# Patient Record
Sex: Female | Born: 1995 | Race: White | Hispanic: No | Marital: Married | Smoking: Never smoker
Health system: Southern US, Community
[De-identification: ages and names within clinical notes are randomized; demographics above are authoritative.]

## PROBLEM LIST (undated history)

## (undated) DIAGNOSIS — M797 Fibromyalgia: Secondary | ICD-10-CM

## (undated) DIAGNOSIS — R519 Headache, unspecified: Secondary | ICD-10-CM

## (undated) DIAGNOSIS — F329 Major depressive disorder, single episode, unspecified: Secondary | ICD-10-CM

## (undated) DIAGNOSIS — F32A Depression, unspecified: Secondary | ICD-10-CM

## (undated) DIAGNOSIS — T7840XA Allergy, unspecified, initial encounter: Secondary | ICD-10-CM

## (undated) DIAGNOSIS — F419 Anxiety disorder, unspecified: Secondary | ICD-10-CM

## (undated) DIAGNOSIS — R51 Headache: Secondary | ICD-10-CM

## (undated) HISTORY — DX: Headache, unspecified: R51.9

## (undated) HISTORY — DX: Allergy, unspecified, initial encounter: T78.40XA

## (undated) HISTORY — PX: WISDOM TOOTH EXTRACTION: SHX21

## (undated) HISTORY — DX: Headache: R51

## (undated) HISTORY — DX: Major depressive disorder, single episode, unspecified: F32.9

## (undated) HISTORY — PX: FOOT SURGERY: SHX648

## (undated) HISTORY — DX: Anxiety disorder, unspecified: F41.9

## (undated) HISTORY — PX: FRACTURE SURGERY: SHX138

## (undated) HISTORY — DX: Depression, unspecified: F32.A

## (undated) HISTORY — DX: Fibromyalgia: M79.7

---
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
## 2017-09-09 ENCOUNTER — Ambulatory Visit (INDEPENDENT_AMBULATORY_CARE_PROVIDER_SITE_OTHER): Payer: 59 | Admitting: Physician Assistant

## 2017-09-09 ENCOUNTER — Other Ambulatory Visit: Payer: Self-pay

## 2017-09-09 ENCOUNTER — Encounter: Payer: Self-pay | Admitting: Physician Assistant

## 2017-09-09 VITALS — BP 110/78 | HR 84 | Temp 98.5°F | Resp 18 | Ht 69.0 in | Wt 153.0 lb

## 2017-09-09 DIAGNOSIS — Z0000 Encounter for general adult medical examination without abnormal findings: Secondary | ICD-10-CM | POA: Diagnosis not present

## 2017-09-09 DIAGNOSIS — K589 Irritable bowel syndrome without diarrhea: Secondary | ICD-10-CM | POA: Insufficient documentation

## 2017-09-09 DIAGNOSIS — N83201 Unspecified ovarian cyst, right side: Secondary | ICD-10-CM | POA: Diagnosis not present

## 2017-09-09 DIAGNOSIS — F411 Generalized anxiety disorder: Secondary | ICD-10-CM

## 2017-09-09 DIAGNOSIS — N83202 Unspecified ovarian cyst, left side: Secondary | ICD-10-CM

## 2017-09-09 DIAGNOSIS — N83209 Unspecified ovarian cyst, unspecified side: Secondary | ICD-10-CM | POA: Insufficient documentation

## 2017-09-09 MED ORDER — DULOXETINE HCL 60 MG PO CPEP: 60.0000 mg | ORAL_CAPSULE | Freq: Every day | ORAL | 1 refills | Status: DC
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
## 2017-10-03 ENCOUNTER — Other Ambulatory Visit: Payer: Self-pay | Admitting: Physician Assistant

## 2017-10-03 DIAGNOSIS — F411 Generalized anxiety disorder: Secondary | ICD-10-CM

## 2017-10-21 ENCOUNTER — Ambulatory Visit (INDEPENDENT_AMBULATORY_CARE_PROVIDER_SITE_OTHER): Payer: 59 | Admitting: Physician Assistant

## 2017-10-21 ENCOUNTER — Encounter: Payer: Self-pay | Admitting: Physician Assistant

## 2017-10-21 VITALS — BP 110/60 | HR 97 | Temp 98.2°F | Resp 18 | Ht 69.0 in | Wt 154.8 lb

## 2017-10-21 DIAGNOSIS — F411 Generalized anxiety disorder: Secondary | ICD-10-CM
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
## 2018-03-22 ENCOUNTER — Other Ambulatory Visit: Payer: Self-pay | Admitting: Family Medicine

## 2018-04-19 ENCOUNTER — Other Ambulatory Visit: Payer: Self-pay | Admitting: Family Medicine

## 2018-04-19 DIAGNOSIS — F411 Generalized anxiety disorder: Secondary | ICD-10-CM

## 2018-04-19 NOTE — Telephone Encounter (Signed)
Last seen: 10/21/2017 Last office visit: Last filled 10/05/2017

## 2018-04-20 MED ORDER — DULOXETINE HCL 60 MG PO CPEP: ORAL_CAPSULE | ORAL | 0 refills | Status: DC

## 2018-04-20 NOTE — Telephone Encounter (Signed)
Called pharmacy and patient is no longer taking Celexa it was discontinued in August of last year. She is only taking Cymbalta for psych medication.

## 2018-04-20 NOTE — Telephone Encounter (Signed)
Can you please call the pharmacy and verify for me if she is taking celexa 10 mg?   This pt will need some kind of f/up.  She is new pt, est with [facility], cymbalta was a new med as of Sept 2019, and she never followed up.  Not sure how we are setting up telemedicine - but this pt would be one we need to do something like that for - or if we have the clinic set up for only well visits- she could come in for OV.

## 2018-04-21 ENCOUNTER — Other Ambulatory Visit: Payer: Self-pay

## 2018-04-21 ENCOUNTER — Ambulatory Visit (INDEPENDENT_AMBULATORY_CARE_PROVIDER_SITE_OTHER): Payer: Self-pay | Admitting: Family Medicine

## 2018-04-21 ENCOUNTER — Ambulatory Visit: Payer: 59 | Admitting: Physician Assistant

## 2018-04-21 ENCOUNTER — Encounter: Payer: Self-pay | Admitting: Family Medicine

## 2018-04-21 DIAGNOSIS — F411 Generalized anxiety disorder: Secondary | ICD-10-CM

## 2018-04-21 DIAGNOSIS — N83201 Unspecified ovarian cyst, right side: Secondary | ICD-10-CM | POA: Insufficient documentation

## 2018-04-21 DIAGNOSIS — N83202 Unspecified ovarian cyst, left side: Secondary | ICD-10-CM

## 2018-04-21 DIAGNOSIS — Z8742 Personal history of other diseases of the female genital tract: Secondary | ICD-10-CM | POA: Insufficient documentation

## 2018-04-21 MED ORDER — LEVONORGEST-ETH ESTRAD 91-DAY 0.15-0.03 &0.01 MG PO TABS: 1.0000 | ORAL_TABLET | Freq: Every day | ORAL | 3 refills | Status: DC

## 2018-04-21 MED ORDER — DULOXETINE HCL 60 MG PO CPEP: ORAL_CAPSULE | ORAL | 3 refills | Status: DC

## 2018-04-21 NOTE — Assessment & Plan Note (Signed)
Refill of oral birth control sent to new pharmacy  No contraindications F/up with OBGYN in several months from now (due to COVID-19) to est with specialist in area

## 2018-04-21 NOTE — Assessment & Plan Note (Signed)
Taking cymbalta 60 mg as prescribed, compliant, no SE Sx well controlled Refills sent to new pharmacy Encouraged 4-6 month f/up in office to in person establish care and f/up

## 2018-04-21 NOTE — Progress Notes (Signed)
Virtual Visit via Telephone Note  Telephone appointment arranged with Karalee Height on 04/21/18 at 12:00 PM EDT by telephone and verified that I am speaking with the correct person using two identifiers.   I discussed the limitations, risks, security and privacy concerns of performing an evaluation and management service by telephone and the availability of in person appointments. I also discussed with the patient that there may be a patient responsible charge related to this service. The patient expressed understanding and agreed to proceed.  Services provided today were via telemedicine through telephone call.  Patient reported their location during encounter was at home.  Patient consented to telephone visit  I conducted telephone visit from [facility]  Referring Provider:   Delsa Grana, PA-C   Start of phone call:  12:15 PM  All participants in encounter: Myself and pt   History of Present Illness: Pt was on celexa for 1-2 years and it stopped being effective, about 6 month ago she has OV with Karis Juba and med was changed to cymbalta 60 mg.  On Cymbalta, her symptoms are much better.  Since starting the medication 6 months ago she did not have any adverse side effects, she feels that the same dose is effective, and she has no concerns at this time.  She is due for med refill. She has not been seen since her visit last September 2019 with [facility], who unfortunately left the practice, I have asked to connect via telephone visit to discuss sx and meds with her before refilling her medications, having never met her. She states that her symptoms seem well managed, only notes a history of "anxiety" per chart review has a history of anxiety and depression.  She started a new job this week and is working at  [employer], and has had to do so from home without normal on the job training.  She feels that she is handling this stressful life change  fairly well, specially considering all the stress and uncertainty of NTIRW-43.   She is trying to stay at home as much as possible, she is avoiding family members that are immunocompromise to or elderly.  She is very adamant about staying away from anyone at risk for the next 4 months, and additionally notes that the nursing homes a fellow members are and have been close down. She will be working from home, efforts have been made to send out greetings to her and welcomes via email and offer to Skype or face time with new coworkers and faculty as she starts her job.   GAD-7 done:   04/21/18 1225      GAD-7 Over the last 2 weeks, how often have you been bothered by the following problems?  1. Feeling Nervous, Anxious, or on Edge Over half the days  2. Not Being Able to Stop or Control Worrying Several days  3. Worrying Too Much About Different Things Several days  4. Trouble Relaxing Not at all sure  5. Being So Restless it's Hard To Sit Still Several days  6. Becoming Easily Annoyed or Irritable Not at all sure  7. Feeling Afraid As If Something Awful Might Happen Over half the days  Total GAD-7 Score 7 (calculated)  Anxiety Difficulty  Difficulty At Work, Home, or [place] With Others? Somewhat difficult     Patient denies any depressive thoughts, suicidal ideations, homicidal ideations or auditory or visual hallucinations.   Patient notes a history of suspected  depression in her father but no known or confirmed diagnoses she denies any history of bipolar, specified anxiety disorders, or substance abuse, alcohol abuse, or exposure to trauma or domestic violence in her past.  Patient would like her Cymbalta refilled and sent to new pharmacy.  She does clarify that she has not taken Celexa since last year.  Med list was reviewed, there was duplicate oral birth control.  She confirms that she is sexually active, monogamous with her husband. Needs refill of birth control  She gives  additional med history stating that a doctor in different location has suspected that she has endometriosis and has switched her to [facility] to give her 66-monthcoverage.  Also reports history of hemorrhagic cysts, intermittent severe pain with menses, alternating severe and heavy bleeding with periods and alternating light periods.  She currently is on the second pack of the last 372-monthupply that she has and request a refill be sent to a different pharmacy as she has moved, she asked for to be [facility] She denies any breaks, gaps or missed doses in taking birth control, denies any chance of pregnancy, denies any current cigarette smoking, denies any history of DVT or PE.    No local OB/GYN, she should not mind establishing with specialist 3-4 months from now.   Patient Active Problem List   Diagnosis Date Noted  . History of menorrhagia 04/21/2018  . Cysts of both ovaries 04/21/2018  . Ovarian cyst 09/09/2017  . IBS (irritable bowel syndrome) 09/09/2017  . Generalized anxiety disorder 09/09/2017  . Capsulitis of toe 11/01/2012  . Pain 11/01/2012  . Neck pain 07/29/2012     Prior to Admission medications   Medication Sig Start Date End Date Taking? Authorizing Provider  acetaminophen (TYLENOL) 500 MG tablet Take 500 mg by mouth every 6 (six) hours as needed for pain.   Yes [provider]  cyclobenzaprine (FLEXERIL) 10 MG tablet Take 10 mg by mouth as needed for muscle spasms.   Yes [provider]  Levonorgestrel-Ethinyl Estradiol (AMETHIA,CAMRESE) 0.15-0.03 &0.01 MG tablet TAKE 1 TABLET BY MOUTH EVERY DAY 03/22/18  Yes TaDelsa GranaPA-C  Naproxen Sodium (ALEVE PO) Take by mouth as needed.   Yes [provider]  DULoxetine (CYMBALTA) 60 MG capsule TAKE 1 CAPSULE BY MOUTH EVERY DAY 04/21/18   TaDelsa GranaPA-C  Levonorgestrel-Ethinyl Estradiol (AMETHIA,CAMRESE) 0.15-0.03 &0.01 MG tablet Take 1 tablet by mouth daily.  05/22/18   TaDelsa GranaPA-C     Allergies  Allergen Reactions  . Codeine Nausea Only and Nausea And Vomiting     Family History  Problem Relation Age of Onset  . Hypertension Mother   . Cancer Paternal Aunt      Social History   Socioeconomic History  . Marital status: Married    Spouse name: Not on file  . Number of children: Not on file  . Years of education: Not on file  . Highest education level: Not on file  Occupational History  . Not on file  Social Needs  . Financial resource strain: Not on file  . Food insecurity:    Worry: Not on file    Inability: Not on file  . Transportation needs:    Medical: Not on file    Non-medical: Not on file  Tobacco Use  . Smoking status: Never Smoker  . Smokeless tobacco: Never Used  Substance and Sexual Activity  . Alcohol use: No  . Drug  use: No  . Sexual activity: Yes    Birth control/protection: Pill  Lifestyle  . Physical activity:    Days per week: Not on file    Minutes per session: Not on file  . Stress: Not on file  Relationships  . Social connections:    Talks on phone: Not on file    Gets together: Not on file    Attends religious service: Not on file    Active member of club or organization: Not on file    Attends meetings of clubs or organizations: Not on file    Relationship status: Not on file  . Intimate partner violence:    Fear of current or ex partner: Not on file    Emotionally abused: Not on file    Physically abused: Not on file    Forced sexual activity: Not on file  Other Topics Concern  . Not on file  Social History Narrative  . Not on file    Observations/Objective: Limited objective findings secondary to telephone encounter/telemedicine visit  Physical Exam Neurological:     Mental Status: She is alert.     Comments: Answers questions quickly, accurately and appropriately  Psychiatric:        Attention and Perception: Attention normal. She is attentive.        Mood and  Affect: Mood and affect normal. Mood is not anxious, depressed or elated. Affect is not labile, blunt, flat, tearful or inappropriate.        Speech: Speech normal.        Behavior: Behavior is cooperative.        Thought Content: Thought content normal. Thought content is not paranoid or delusional. Thought content does not include homicidal or suicidal ideation. Thought content does not include homicidal or suicidal plan.        Cognition and Memory: Memory normal.        Judgment: Judgment normal.     Assessment and Plan:  Problem List Items Addressed This Visit      Endocrine   Cysts of both ovaries   Relevant Medications   Levonorgestrel-Ethinyl Estradiol (AMETHIA,CAMRESE) 0.15-0.03 &0.01 MG tablet (Start on 05/22/2018)   Other Relevant Orders   Ambulatory referral to Obstetrics / Gynecology     Other   Generalized anxiety disorder - Primary    Taking cymbalta 60 mg as prescribed, compliant, no SE Sx well controlled Refills sent to new pharmacy Encouraged 4-6 month f/up in office to in person establish care and f/up      Relevant Medications   DULoxetine (CYMBALTA) 60 MG capsule   History of menorrhagia    Refill of oral birth control sent to new pharmacy  No contraindications F/up with OBGYN in several months from now (due to COVID-19) to est with specialist in area      Relevant Medications   Levonorgestrel-Ethinyl Estradiol (AMETHIA,CAMRESE) 0.15-0.03 &0.01 MG tablet (Start on 05/22/2018)   Other Relevant Orders   Ambulatory referral to Obstetrics / Gynecology     Chart reviewed and update with pt to establish care with me as new PCP, last PCP left practice. F/up in office visit when able.  Follow Up Instructions: Several months of meds sent to pharmacy, everything seems very stable, encouraged her to contact us as needed for any concerns, SE, or new sx.   I discussed the assessment and treatment plan with the patient. The patient was provided an opportunity to  ask questions and all were answered. The patient agreed  with the plan and demonstrated an understanding of the instructions.   The patient was advised to call back or seek an in-person evaluation if the symptoms worsen or if the condition fails to improve as anticipated.   12:38 PM I provided 22 minutes of non-face-to-face time during this encounter.   Delsa Grana, PA-C

## 2018-04-22 ENCOUNTER — Telehealth: Payer: Self-pay | Admitting: Obstetrics & Gynecology

## 2018-04-22 NOTE — Telephone Encounter (Signed)
We have a referral for this patient, she'll be a new patient to our office.  The referral is for cyst on both ovaries.  Office visit?  If so, How far out can I schedule her?

## 2018-04-22 NOTE — Telephone Encounter (Signed)
I will need to see the imaging she has had to make a determination there is nothing in EPIC

## 2018-04-23 NOTE — Telephone Encounter (Signed)
Per our conversation.

## 2018-04-26 ENCOUNTER — Encounter: Payer: Self-pay | Admitting: Obstetrics & Gynecology

## 2018-04-26 NOTE — Telephone Encounter (Signed)
I scheduled the patient with Dr. Despina Hidden on 05/18/18 at 1:30pm.  I was unable to reach the patient by phone, mailed her a letter with her appointment information.

## 2018-05-18 ENCOUNTER — Encounter: Payer: Self-pay | Admitting: Obstetrics & Gynecology

## 2018-05-18 ENCOUNTER — Other Ambulatory Visit: Payer: Self-pay

## 2018-05-18 ENCOUNTER — Ambulatory Visit (INDEPENDENT_AMBULATORY_CARE_PROVIDER_SITE_OTHER): Admitting: Obstetrics & Gynecology

## 2018-05-18 VITALS — Ht 70.0 in | Wt 150.0 lb

## 2018-05-18 DIAGNOSIS — N83201 Unspecified ovarian cyst, right side: Secondary | ICD-10-CM

## 2018-05-18 DIAGNOSIS — N83202 Unspecified ovarian cyst, left side: Secondary | ICD-10-CM

## 2018-05-18 MED ORDER — LEVONORGEST-ETH ESTRAD 91-DAY 0.15-0.03 &0.01 MG PO TABS: 1.0000 | ORAL_TABLET | Freq: Every day | ORAL | 4 refills | Status: DC

## 2018-05-18 NOTE — Progress Notes (Signed)
   TELEHEALTH VIRTUAL GYNECOLOGY VISIT ENCOUNTER NOTE  I connected with Lisa Craig on 05/18/18 at  1:30 PM EDT by telephone at home and verified that I am speaking with the correct person using two identifiers.   I discussed the limitations, risks, security and privacy concerns of performing an evaluation and management service by telephone and the availability of in person appointments. I also discussed with the patient that there may be a patient responsible charge related to this service. The patient expressed understanding and agreed to proceed.   History:  Lisa Craig is a 23 y.o. No obstetric history on file. female being evaluated today for a history of ovarian cysts, first diiagnosed 10/2016. She denies any abnormal vaginal discharge, bleeding, pelvic pain or other concerns.     then placed on OCP, 3 month birth control, menses are much improved on this regimen,  Had been on nexplanon but had prolonged spotting and discontinued Not having any recent problems specifically  Had scans done 10/18-1/19 none since  Rare pain with intercourse No pain otherwise except some menstrual cramps, not significantly     Past Medical History:  Diagnosis Date  . Allergy   . Anxiety   . Depression   . Frequent headaches    Past Surgical History:  Procedure Laterality Date  . FOOT SURGERY Right   . WISDOM TOOTH EXTRACTION     The following portions of the patient's history were reviewed and updated as appropriate: allergies, current medications, past family history, past medical history, past social history, past surgical history and problem list.   Health Maintenance:  Normal pap and negative HRHPV on usure.  Normal mammogram on n/a.   Review of Systems:  Pertinent items noted in HPI and remainder of comprehensive ROS otherwise negative.  Physical Exam:  Physical exam deferred due to nature of the encounter  Labs and Imaging No results found for this or any previous visit (from the  past 336 hour(s)). No results found.     Meds ordered this encounter  Medications  . Levonorgestrel-Ethinyl Estradiol (SEASONIQUE) 0.15-0.03 &0.01 MG tablet    Sig: Take 1 tablet by mouth daily.    Dispense:  1 Package    Refill:  4    No orders of the defined types were placed in this encounter.   Assessment and Plan:     Cysts of both ovaries some concern for possible endometriosis  Currently on seasonique  OCP for cycle management and to suppress ovarian cyst formation  I completely agree with continuing these OCPs, they give Lisa Craig good progestational suppression which she has done well with  Do not think Lisa Craig is needed or indicated given her excellent response to heavy handed progesterone, she agrees  Lisa Craig is refilled for patient       I discussed the assessment and treatment plan with the patient. The patient was provided an opportunity to ask questions and all were answered. The patient agreed with the plan and demonstrated an understanding of the instructions.   The patient was advised to call back or seek an in-person evaluation/go to the ED if the symptoms worsen or if the condition fails to improve as anticipated.  I provided  12 minutes of non-face-to-face time during this encounter.   Lazaro Arms, MD [facility]

## 2019-03-08 ENCOUNTER — Other Ambulatory Visit: Payer: Self-pay | Admitting: Family Medicine

## 2019-03-08 DIAGNOSIS — F411 Generalized anxiety disorder: Secondary | ICD-10-CM

## 2019-04-18 LAB — HM PAP SMEAR

## 2019-06-03 ENCOUNTER — Other Ambulatory Visit: Payer: Self-pay | Admitting: Obstetrics & Gynecology

## 2019-08-03 ENCOUNTER — Other Ambulatory Visit: Payer: Self-pay | Admitting: Orthopedic Surgery

## 2019-08-03 DIAGNOSIS — M5136 Other intervertebral disc degeneration, lumbar region: Secondary | ICD-10-CM

## 2020-03-13 ENCOUNTER — Other Ambulatory Visit: Payer: Self-pay | Admitting: Obstetrics & Gynecology

## 2020-08-06 ENCOUNTER — Encounter: Payer: Self-pay | Admitting: Podiatry

## 2020-08-06 ENCOUNTER — Ambulatory Visit: Admitting: Podiatry

## 2020-08-06 ENCOUNTER — Ambulatory Visit

## 2020-08-06 DIAGNOSIS — M66271 Spontaneous rupture of extensor tendons, right ankle and foot: Secondary | ICD-10-CM

## 2020-08-06 DIAGNOSIS — S92501A Displaced unspecified fracture of right lesser toe(s), initial encounter for closed fracture: Secondary | ICD-10-CM | POA: Diagnosis not present

## 2020-08-06 DIAGNOSIS — S97121A Crushing injury of right lesser toe(s), initial encounter: Secondary | ICD-10-CM

## 2020-08-06 DIAGNOSIS — Q709 Syndactyly, unspecified: Secondary | ICD-10-CM | POA: Diagnosis not present

## 2020-08-06 NOTE — Progress Notes (Signed)
  Subjective:  Patient ID: Lisa Craig, female    DOB: December 07, 1995,  MRN: [id number]  Chief Complaint  Patient presents with   Fracture    re est 2014 -broken pinky toe right foot    25 y.o. female presents with the above complaint. History confirmed with patient.  She injured her right fifth toe and caught it and was forcibly plantarflexed 1 week ago on 4 July.  She felt something snap.  She had a previous history of tendon and skin lengthening surgery to reduce a floating toe.  She had x-rays completed last week.  Objective:  Physical Exam: warm, good capillary refill, no trophic changes or ulcerative lesions, normal DP and PT pulses, and normal sensory exam.  She has sharp pain on the distal phalanx of the fifth toe and is unable to dorsiflex the toe   radiographs: Multiple views x-ray of the right foot reviewed from urgent care there is a small avulsion fracture at the base of the proximal phalanx as well, compared to prior radiographs in 2014 there is no longer some phalanges him of the middle and distal phalanx think she has a fracture of the previous synchondrosis here Assessment:   1. Closed fracture of phalanx of right fifth toe, initial encounter   2. Symphalangism of distal interphalangeal (DIP) joints   3. 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  Reviewed radiographic and clinical findings with the patient.  I think she has a refracture of the previous synostosis/synchondrosis of the middle and distal phalanges.  Additionally I think she likely ruptured the extensor tendon that was previously lengthened.  I discussed with her I will think there is a reasonable way to repair this currently.  Discussed with her if the plantar flexor he position of the toe continues to be bothersome or symptomatic then flexor tendon release could straighten the toe out.  I demonstrated splinting the toe with  buddy taping as well as Steri-Strips in a dorsiflexed position to allow to heal.  She will do this and return as needed  Return if symptoms worsen or fail to improve.

## 2020-09-12 ENCOUNTER — Other Ambulatory Visit: Payer: Self-pay

## 2020-09-12 ENCOUNTER — Encounter: Payer: Self-pay | Admitting: Podiatry

## 2020-09-12 ENCOUNTER — Ambulatory Visit (INDEPENDENT_AMBULATORY_CARE_PROVIDER_SITE_OTHER)

## 2020-09-12 ENCOUNTER — Ambulatory Visit: Admitting: Podiatry

## 2020-09-12 DIAGNOSIS — S92501A Displaced unspecified fracture of right lesser toe(s), initial encounter for closed fracture: Secondary | ICD-10-CM

## 2020-09-12 DIAGNOSIS — M66271 Spontaneous rupture of extensor tendons, right ankle and foot: Secondary | ICD-10-CM | POA: Diagnosis not present

## 2020-09-12 DIAGNOSIS — Q709 Syndactyly, unspecified: Secondary | ICD-10-CM | POA: Diagnosis not present

## 2020-09-12 NOTE — Progress Notes (Signed)
She presents today for second opinion she injured her toe on 30 July 2020 1 that we had performed a skin lengthening on and a cocked up hammertoe repair.  This had done very well until she stubbed the toe rolling the toe plantarly and plantar flexing the fifth metatarsal phalangeal joint aggressively.  She states that she felt and heard a snap.  Objective: Vital signs are stable alert oriented x3.  Pulses are palpable.  There is no erythema edema cellulitis drainage or odor the fifth digit is flail on dorsiflexion I am not able to palpate the long extensor tendon though the short flexor is intact.  She does have a rigid deformity at the PIPJ and more of a plantar flexion really internally rotated position.  Assessment torn extensor tendon fifth metatarsal phalangeal joint.  Plan: At this point I highly recommended that she have an MRI for evaluation for possible repair.  She is asking for an amputation of the toe I really do not want her to go through an amputation at a young age possibly having to deal with phantom pain after the rest of her life or discourse from friends and family members.  I will follow-up with her after her MRI is done.

## 2020-09-22 ENCOUNTER — Other Ambulatory Visit: Payer: Self-pay

## 2020-09-22 ENCOUNTER — Ambulatory Visit
Admission: RE | Admit: 2020-09-22 | Discharge: 2020-09-22 | Disposition: A | Discharge status: Home / Self Care | Source: Ambulatory Visit | Attending: Podiatry | Admitting: Podiatry

## 2020-09-22 DIAGNOSIS — Q709 Syndactyly, unspecified: Secondary | ICD-10-CM

## 2020-09-22 DIAGNOSIS — M66271 Spontaneous rupture of extensor tendons, right ankle and foot: Secondary | ICD-10-CM

## 2020-10-03 ENCOUNTER — Encounter: Payer: Self-pay | Admitting: Podiatry

## 2020-10-03 ENCOUNTER — Ambulatory Visit: Admitting: Podiatry

## 2020-10-03 ENCOUNTER — Other Ambulatory Visit: Payer: Self-pay

## 2020-10-03 DIAGNOSIS — M66271 Spontaneous rupture of extensor tendons, right ankle and foot: Secondary | ICD-10-CM | POA: Diagnosis not present

## 2020-10-03 DIAGNOSIS — M2041 Other hammer toe(s) (acquired), right foot: Secondary | ICD-10-CM

## 2020-10-03 NOTE — Progress Notes (Signed)
She presents today to discuss surgical options regarding a ruptured extensor tendon left foot.  Objective: Vital signs are stable alert and oriented x3.  Pulses are palpable.  Fifth digit does demonstrate plantar flexion contracture at the PIPJ secondary to fracture.  It is rigid at this point.  MRI does relate with a tear of the extensor tendon at the level of the PIPJ fifth digit right foot.  Assessment: Rupture extensor tendon fifth digit right.  Plan: Consented her today for extensor tendon repair and arthroplasty arthrodesis fifth digit right foot.  We did discuss the possible alternatives such as amputation of the toe which we will discuss should this fail or become too complicated to repair.  We did discuss the possible postop complications which may include but not limited to postop pain bleeding swelling infection recurrence need further surgery overcorrection under correction loss of digit loss of limb loss of life.  I will follow-up with her in the near future for repair of this toe general anesthesia no block.

## 2020-10-17 ENCOUNTER — Other Ambulatory Visit: Payer: Self-pay | Admitting: Podiatry

## 2020-10-17 MED ORDER — ONDANSETRON HCL 4 MG PO TABS: 4.0000 mg | ORAL_TABLET | Freq: Three times a day (TID) | ORAL | 0 refills | Status: DC | PRN

## 2020-10-17 MED ORDER — OXYCODONE-ACETAMINOPHEN 10-325 MG PO TABS: 1.0000 | ORAL_TABLET | Freq: Three times a day (TID) | ORAL | 0 refills | Status: AC | PRN

## 2020-10-17 MED ORDER — CEPHALEXIN 500 MG PO CAPS: 500.0000 mg | ORAL_CAPSULE | Freq: Three times a day (TID) | ORAL | 0 refills | Status: DC

## 2020-10-19 DIAGNOSIS — M66271 Spontaneous rupture of extensor tendons, right ankle and foot: Secondary | ICD-10-CM | POA: Diagnosis not present

## 2020-10-19 DIAGNOSIS — M2041 Other hammer toe(s) (acquired), right foot: Secondary | ICD-10-CM | POA: Diagnosis not present

## 2020-10-24 ENCOUNTER — Other Ambulatory Visit: Payer: Self-pay

## 2020-10-24 ENCOUNTER — Ambulatory Visit (INDEPENDENT_AMBULATORY_CARE_PROVIDER_SITE_OTHER)

## 2020-10-24 ENCOUNTER — Encounter: Payer: Self-pay | Admitting: Podiatry

## 2020-10-24 ENCOUNTER — Ambulatory Visit (INDEPENDENT_AMBULATORY_CARE_PROVIDER_SITE_OTHER): Admitting: Podiatry

## 2020-10-24 DIAGNOSIS — Z9889 Other specified postprocedural states: Secondary | ICD-10-CM

## 2020-10-24 DIAGNOSIS — M2041 Other hammer toe(s) (acquired), right foot: Secondary | ICD-10-CM | POA: Diagnosis not present

## 2020-10-24 DIAGNOSIS — M66271 Spontaneous rupture of extensor tendons, right ankle and foot: Secondary | ICD-10-CM

## 2020-10-24 NOTE — Progress Notes (Signed)
She presents today date of surgery 10/19/2020 right foot arthroplasty with fusion fifth and repair of the extensor tendon.  States that her big toe is somewhat numb and the top of her foot hurts where the bandage was.  She states that the pain medication is really not helping but she has gabapentin at home and would like to try that.  She says she has gabapentin 100 mg.  Objective: Presents nonweightbearing with crutches and has been.  Cam boot intact dry sterile dressing intact was removed demonstrates into the fifth digit of the right foot is intact there is no erythema just some mild edema no cellulitis drainage or odor sutures are intact margins well coapted radiographs demonstrate well-placed hammertoe repair fifth right.  Assessment well-healing surgical foot.  Plan: Redressed today dressed a compressive dressing encouraged her to use the gabapentin rather than the oxycodone if necessary.  Explained to her that she can still take ibuprofen and Tylenol with this I would like to follow-up with her in 1 week for suture removal if possible.

## 2020-10-31 ENCOUNTER — Encounter: Payer: Self-pay | Admitting: Podiatry

## 2020-10-31 ENCOUNTER — Ambulatory Visit (INDEPENDENT_AMBULATORY_CARE_PROVIDER_SITE_OTHER): Admitting: Podiatry

## 2020-10-31 ENCOUNTER — Other Ambulatory Visit: Payer: Self-pay

## 2020-10-31 DIAGNOSIS — M2041 Other hammer toe(s) (acquired), right foot: Secondary | ICD-10-CM

## 2020-10-31 DIAGNOSIS — Z9889 Other specified postprocedural states: Secondary | ICD-10-CM

## 2020-10-31 DIAGNOSIS — M66271 Spontaneous rupture of extensor tendons, right ankle and foot: Secondary | ICD-10-CM

## 2020-10-31 MED ORDER — GABAPENTIN 100 MG PO CAPS: 100.0000 mg | ORAL_CAPSULE | Freq: Every day | ORAL | 3 refills | Status: DC

## 2020-10-31 MED ORDER — GABAPENTIN 100 MG PO CAPS
100.0000 mg | ORAL_CAPSULE | Freq: Every day | ORAL | 3 refills | Status: DC
Start: 2020-10-31 — End: 2020-10-31

## 2020-10-31 NOTE — Progress Notes (Signed)
She presents today date of surgery 10/19/2020 status post arthrodesis of the fifth toe with a tailor's bunionectomy.  States the incision feels better no problems there.  She said the bone still is a bit sore though.  He also like a refill on her gabapentin 100 mg.  Objective: Vital signs are stable she is alert oriented x3 there is no erythema just some mild edema no cellulitis drainage or odor appears to be a rubbing injury on the posterior aspect of her Achilles area left does not demonstrate any signs of infection at this time.  Sutures are intact margins well coapted K wires intact to the fifth toe.  Assessment well-healing surgical foot.  Plan: Sutures were removed today light dressing was applied we will go to request that she utilize her Darco shoe.  I refilled her gabapentin 100 mg to be taken at nighttime.  I also recommended placing a Band-Aid on the area on the posterior aspect of the leg.  I would like to follow-up with her in about 2 weeks for set of x-rays.

## 2020-11-05 ENCOUNTER — Encounter: Payer: Self-pay | Admitting: Podiatry

## 2020-11-14 ENCOUNTER — Ambulatory Visit (INDEPENDENT_AMBULATORY_CARE_PROVIDER_SITE_OTHER)

## 2020-11-14 ENCOUNTER — Other Ambulatory Visit: Payer: Self-pay

## 2020-11-14 ENCOUNTER — Ambulatory Visit (INDEPENDENT_AMBULATORY_CARE_PROVIDER_SITE_OTHER): Admitting: Podiatry

## 2020-11-14 ENCOUNTER — Encounter: Payer: Self-pay | Admitting: Podiatry

## 2020-11-14 DIAGNOSIS — M66271 Spontaneous rupture of extensor tendons, right ankle and foot: Secondary | ICD-10-CM

## 2020-11-14 DIAGNOSIS — M2041 Other hammer toe(s) (acquired), right foot: Secondary | ICD-10-CM

## 2020-11-14 DIAGNOSIS — Z9889 Other specified postprocedural states: Secondary | ICD-10-CM

## 2020-11-14 NOTE — Progress Notes (Signed)
She presents today date of surgery 10/19/2020 right foot arthroplasty fusion fifth toe due to extensor tendon tear states that is feeling better the pain is irritating my fourth toe.  Denies fever chills nausea run muscle aches and pains.  Objective: Vital signs are stable alert and oriented x3 there is no erythema edema cellulitis drainage or odor and K wire to the fifth toe is intact and appears to be healing radiographically.  Does not appear to be healed yet hesitant to pulling the pin at this point that she is requesting it.  She would like to have it pulled prior to her going to a wedding next week.  Assessment well-healing surgical foot I do not think the toe is completely healed as far as fusion goes yet.  Plan: At this point she is really requesting that the pin be pulled before the end of next week so that she can go to on her trip to the wedding.  I am going to set her up with Dr. Allena Katz waiting to the last minute to pull the pin on Thursday before she leaves on Friday.  I will follow-up with her a couple weeks after that.

## 2020-11-22 ENCOUNTER — Ambulatory Visit: Admitting: Podiatry

## 2020-11-28 ENCOUNTER — Other Ambulatory Visit: Payer: Self-pay

## 2020-11-28 ENCOUNTER — Other Ambulatory Visit: Payer: Self-pay | Admitting: Podiatry

## 2020-11-28 ENCOUNTER — Ambulatory Visit (INDEPENDENT_AMBULATORY_CARE_PROVIDER_SITE_OTHER): Admitting: Podiatry

## 2020-11-28 ENCOUNTER — Encounter: Payer: Self-pay | Admitting: Podiatry

## 2020-11-28 ENCOUNTER — Ambulatory Visit (INDEPENDENT_AMBULATORY_CARE_PROVIDER_SITE_OTHER)

## 2020-11-28 DIAGNOSIS — M66271 Spontaneous rupture of extensor tendons, right ankle and foot: Secondary | ICD-10-CM | POA: Diagnosis not present

## 2020-11-28 DIAGNOSIS — M2041 Other hammer toe(s) (acquired), right foot: Secondary | ICD-10-CM

## 2020-11-28 DIAGNOSIS — Z9889 Other specified postprocedural states: Secondary | ICD-10-CM

## 2020-11-28 NOTE — Progress Notes (Signed)
  Subjective:  Patient ID: Lisa Craig, female    DOB: 1995/05/23,  MRN: [id number]  Chief Complaint  Patient presents with   Routine Post Op    POV #4 DOS 10/19/2020 RT FOOT ARTHROPLASTY/FUSION OF THE 5TH TOE, REPAIR OF THE EXTENSOR TENDON 5TH TOE    "I went to a wedding and I was on it a lot. It was very sore and swollen, still pretty sore. All the toes are still numb as well"     25 y.o. female returns for post-op check.  Overall improving pain is coming down.  Thinks the pain may have displaced a bit  Review of Systems: Negative except as noted in the HPI. Denies N/V/F/Ch.   Objective:  There were no vitals filed for this visit. There is no height or weight on file to calculate BMI. Constitutional Well developed. Well nourished.  Vascular Foot warm and well perfused. Capillary refill normal to all digits.   Neurologic Normal speech. Oriented to person, place, and time. Epicritic sensation to light touch grossly present bilaterally.  Dermatologic Skin healing well without signs of infection. Skin edges well coapted without signs of infection.  Orthopedic: Tenderness to palpation noted about the surgical site.  Kirschner wire still in position   Multiple view plain film radiographs: Some distal translation of the wire but still crossing surgical site Assessment:   1. Hammer toe of right foot   2. 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 Status post right foot surgery    Plan:  Patient was evaluated and treated and all questions answered.  S/p foot surgery right -Progressing as expected post-operatively. -XR: As noted above she had some distal translation of the wire but was not an issue -WB Status: Can transition to regular shoe gear as tolerated -Kirschner wire removed uneventfully she may resume bathing Monday  Return in 4 weeks (on 12/26/2020) for POV w/ xray.

## 2021-01-02 ENCOUNTER — Other Ambulatory Visit: Payer: Self-pay

## 2021-01-02 ENCOUNTER — Ambulatory Visit (INDEPENDENT_AMBULATORY_CARE_PROVIDER_SITE_OTHER)

## 2021-01-02 ENCOUNTER — Encounter: Payer: Self-pay | Admitting: Podiatry

## 2021-01-02 ENCOUNTER — Ambulatory Visit (INDEPENDENT_AMBULATORY_CARE_PROVIDER_SITE_OTHER): Admitting: Podiatry

## 2021-01-02 DIAGNOSIS — M66271 Spontaneous rupture of extensor tendons, right ankle and foot: Secondary | ICD-10-CM

## 2021-01-02 DIAGNOSIS — Z9889 Other specified postprocedural states: Secondary | ICD-10-CM

## 2021-01-02 DIAGNOSIS — M2041 Other hammer toe(s) (acquired), right foot: Secondary | ICD-10-CM

## 2021-01-02 NOTE — Progress Notes (Signed)
She presents today for postop visit date of surgery 10/19/2020 right foot arthroplasty or fusion fifth toe right with extensor tendon transfer states that is doing good had to buy new shoes but other than that she states I am doing pretty well and is sitting rectus.  She states that occasionally it works its way under the fourth toe and is kind of tender.  She is also noted a small nodule on the plantar aspect of the toe itself.  No pain otherwise other than with sharp dorsiflexion.  Objective: Vital signs are stable she is alert and oriented x3 pulses are palpable.  Nodule plantar aspect of the middle of the toe is the condyles of the proximal phalanx distalmost aspect.  There is no callused material associated with this.  She also has some tenderness on dorsiflexion at the metatarsophalangeal joint.  Radiographs taken today demonstrate an osseously mature individual with a tailor's bunionectomy and a fusion of the fifth digit.  It is fused rectus but only demonstrates about 50% or less fusion across the PIPJ.  Assessment: Well-healing surgical foot right.  Plan: Encouraged her to continue to be careful with the fifth toe to make sure that she does not fracture the minimal fusion site and then I recommended that it would go ahead and continue the use of her stiffer soled shoe.  She understands this and is amendable to it I explained to her that this will continue to fuse over time.  If she has any problems with that she will notify us.

## 2021-02-12 ENCOUNTER — Other Ambulatory Visit: Payer: Self-pay

## 2021-02-12 ENCOUNTER — Ambulatory Visit
Admission: RE | Admit: 2021-02-12 | Discharge: 2021-02-12 | Disposition: A | Discharge status: Home / Self Care | Source: Ambulatory Visit | Attending: Student | Admitting: Student

## 2021-02-12 VITALS — BP 123/66 | HR 117 | Temp 98.9°F | Resp 18

## 2021-02-12 DIAGNOSIS — R112 Nausea with vomiting, unspecified: Secondary | ICD-10-CM | POA: Diagnosis not present

## 2021-02-12 DIAGNOSIS — J069 Acute upper respiratory infection, unspecified: Secondary | ICD-10-CM | POA: Diagnosis not present

## 2021-02-12 DIAGNOSIS — R0989 Other specified symptoms and signs involving the circulatory and respiratory systems: Secondary | ICD-10-CM

## 2021-02-12 DIAGNOSIS — Z3041 Encounter for surveillance of contraceptive pills: Secondary | ICD-10-CM

## 2021-02-12 MED ORDER — PREDNISONE 20 MG PO TABS: 40.0000 mg | ORAL_TABLET | Freq: Every day | ORAL | 0 refills | Status: AC

## 2021-02-12 MED ORDER — OSELTAMIVIR PHOSPHATE 75 MG PO CAPS: 75.0000 mg | ORAL_CAPSULE | Freq: Two times a day (BID) | ORAL | 0 refills | Status: DC

## 2021-02-12 MED ORDER — ONDANSETRON 8 MG PO TBDP: 8.0000 mg | ORAL_TABLET | Freq: Three times a day (TID) | ORAL | 0 refills | Status: DC | PRN

## 2021-02-12 NOTE — ED Triage Notes (Signed)
Home covid test yesterday was negative. 

## 2021-02-12 NOTE — Discharge Instructions (Addendum)
-  Tamiflu twice daily x5 days. This medication can cause nausea, so I also sent nausea medication. You can stop the Tamiflu if you don't like it or if it causes side effects.  -Take the Zofran (ondansetron) up to 3 times daily for nausea and vomiting. Dissolve one pill under your tongue or between your teeth and your cheek. -Prednisone, 2 pills taken at the same time for 5 days in a row.  Try taking this earlier in the day as it can give you energy. Avoid NSAIDs like ibuprofen and alleve while taking this medication as they can increase your risk of stomach upset and even GI bleeding when in combination with a steroid. You can continue tylenol (acetaminophen) up to 1000mg  3x daily. -Drink plenty of fluids! -With a virus, you're typically contagious for 5-7 days, or as long as you're having fevers.

## 2021-02-12 NOTE — ED Provider Notes (Signed)
[facility]    CSN: [id number] Arrival date & time: 02/12/21  1438      History   Chief Complaint No chief complaint on file.   HPI Lisa Craig is a 26 y.o. female presenting with sore throat and fevers x2 days.  Medical history IBS. PND and nausea without vomiting, denies abd pain, diarrhea. Myalgias. Cough is nonproductive.. Negative home COVID test on day 2. Has attempted OTC medications. Denies SOB, CP.  HPI  Past Medical History:  Diagnosis Date   Allergy    Anxiety    Depression    Frequent headaches     Patient Active Problem List   Diagnosis Date Noted   History of menorrhagia 04/21/2018   Cysts of both ovaries 04/21/2018   Ovarian cyst 09/09/2017   IBS (irritable bowel syndrome) 09/09/2017   Generalized anxiety disorder 09/09/2017   Capsulitis of toe 11/01/2012   Pain 11/01/2012   Neck pain 07/29/2012    Past Surgical History:  Procedure Laterality Date   FOOT SURGERY Right    WISDOM TOOTH EXTRACTION      OB History   No obstetric history on file.      Home Medications    Prior to Admission medications   Medication Sig Start Date End Date Taking? Authorizing Provider  ondansetron (ZOFRAN-ODT) 8 MG disintegrating tablet Take 1 tablet (8 mg total) by mouth every 8 (eight) hours as needed for nausea or vomiting. 02/12/21  Yes Hazel Sams, PA-C  oseltamivir (TAMIFLU) 75 MG capsule Take 1 capsule (75 mg total) by mouth every 12 (twelve) hours. 02/12/21  Yes Hazel Sams, PA-C  predniSONE (DELTASONE) 20 MG tablet Take 2 tablets (40 mg total) by mouth daily for 5 days. Take with breakfast or lunch. Avoid NSAIDs (ibuprofen, etc) while taking this medication. 02/12/21 02/17/21 Yes Hazel Sams, PA-C  buPROPion (WELLBUTRIN XL) 300 MG 24 hr tablet Take by mouth. 06/21/20 06/21/21  [provider]  SIMPESSE 0.15-0.03 &0.01 MG tablet TAKE 1 TABLET BY MOUTH DAILY 03/13/20   Florian Buff, MD    Family History Family History  Problem  Relation Age of Onset   Hypertension Mother    Cancer Paternal Aunt     Social History Social History   Tobacco Use   Smoking status: Never   Smokeless tobacco: Never  Vaping Use   Vaping Use: Never used  Substance Use Topics   Alcohol use: Yes    Comment: occ. wine   Drug use: No     Allergies   Tramadol and Codeine   Review of Systems Review of Systems  Constitutional:  Negative for appetite change, chills and fever.  HENT:  Positive for congestion and sore throat. Negative for ear pain, rhinorrhea, sinus pressure and sinus pain.   Eyes:  Negative for redness and visual disturbance.  Respiratory:  Positive for cough. Negative for chest tightness, shortness of breath and wheezing.   Cardiovascular:  Negative for chest pain and palpitations.  Gastrointestinal:  Negative for abdominal pain, constipation, diarrhea, nausea and vomiting.  Genitourinary:  Negative for dysuria, frequency and urgency.  Musculoskeletal:  Negative for myalgias.  Neurological:  Negative for dizziness, weakness and headaches.  Psychiatric/Behavioral:  Negative for confusion.   All other systems reviewed and are negative.   Physical Exam Triage Vital Signs ED Triage Vitals  Enc Vitals Group     BP 02/12/21 1446 123/66     Pulse Rate 02/12/21 1446 (!) 117     Resp  02/12/21 1446 18     Temp 02/12/21 1446 98.9 F (37.2 C)     Temp Source 02/12/21 1446 Oral     SpO2 02/12/21 1446 96 %     Weight --      Height --      Head Circumference --      Peak Flow --      Pain Score 02/12/21 1447 4     Pain Loc --      Pain Edu? --      Excl. in [place]? --    No data found.  Updated Vital Signs BP 123/66 (BP Location: Right Arm)    Pulse (!) 117    Temp 98.9 F (37.2 C) (Oral)    Resp 18    LMP 12/11/2020 (Approximate)    SpO2 96%   Visual Acuity Right Eye Distance:   Left Eye Distance:   Bilateral Distance:    Right Eye Near:   Left Eye Near:    Bilateral Near:     Physical Exam Vitals  reviewed.  Constitutional:      General: She is not in acute distress.    Appearance: Normal appearance. She is not ill-appearing.  HENT:     Head: Normocephalic and atraumatic.     Right Ear: Tympanic membrane, ear canal and external ear normal. No tenderness. No middle ear effusion. There is no impacted cerumen. Tympanic membrane is not perforated, erythematous, retracted or bulging.     Left Ear: Tympanic membrane, ear canal and external ear normal. No tenderness.  No middle ear effusion. There is no impacted cerumen. Tympanic membrane is not perforated, erythematous, retracted or bulging.     Nose: Nose normal. No congestion.     Mouth/Throat:     Mouth: Mucous membranes are moist.     Pharynx: Uvula midline. No oropharyngeal exudate or posterior oropharyngeal erythema.  Eyes:     Extraocular Movements: Extraocular movements intact.     Pupils: Pupils are equal, round, and reactive to light.  Cardiovascular:     Rate and Rhythm: Regular rhythm. Tachycardia present.     Heart sounds: Normal heart sounds.  Pulmonary:     Effort: Pulmonary effort is normal.     Breath sounds: Normal breath sounds. No decreased breath sounds, wheezing, rhonchi or rales.  Abdominal:     General: Bowel sounds are increased.     Palpations: Abdomen is soft.     Tenderness: There is no abdominal tenderness. There is no guarding or rebound.     Comments: BS increased throughout. Comfortable throughout exam.  Lymphadenopathy:     Cervical: No cervical adenopathy.     Right cervical: No superficial cervical adenopathy.    Left cervical: No superficial cervical adenopathy.  Neurological:     General: No focal deficit present.     Mental Status: She is alert and oriented to person, place, and time.  Psychiatric:        Mood and Affect: Mood normal.        Behavior: Behavior normal.        Thought Content: Thought content normal.        Judgment: Judgment normal.     UC Treatments / Results   Labs (all labs ordered are listed, but only abnormal results are displayed) Labs Reviewed  COVID-19, FLU A+B NAA    EKG   Radiology No results found.  Procedures Procedures (including critical care time)  Medications Ordered in UC Medications - No data  to display  Initial Impression / Assessment and Plan / UC Course  I have reviewed the triage vital signs and the nursing notes.  Pertinent labs & imaging results that were available during my care of the patient were reviewed by me and considered in my medical decision making (see chart for details).     This patient is a very pleasant 26 y.o. year old female presenting with suspected influenza. OCP contraception. Afebrile but tachycardic at 117, 112 on manual repeat by myself.  Negative home COVID test on day 2. Covid and influenza PCR sent.   Tamiflu, zofran, and prednisone sent.  ED return precautions discussed. Patient verbalizes understanding and agreement.   Coding Level 4 for acute illness with systemic symptoms, and prescription drug management  Final Clinical Impressions(s) / UC Diagnoses   Final diagnoses:  Viral URI with cough  Nausea and vomiting, unspecified vomiting type  Oral contraceptive use  Suspected novel influenza A virus infection     Discharge Instructions      -Tamiflu twice daily x5 days. This medication can cause nausea, so I also sent nausea medication. You can stop the Tamiflu if you don't like it or if it causes side effects.  -Take the Zofran (ondansetron) up to 3 times daily for nausea and vomiting. Dissolve one pill under your tongue or between your teeth and your cheek. -Prednisone, 2 pills taken at the same time for 5 days in a row.  Try taking this earlier in the day as it can give you energy. Avoid NSAIDs like ibuprofen and alleve while taking this medication as they can increase your risk of stomach upset and even GI bleeding when in combination with a steroid. You can continue  tylenol (acetaminophen) up to 1000mg  3x daily. -Drink plenty of fluids! -With a virus, you're typically contagious for 5-7 days, or as long as you're having fevers.        ED Prescriptions     Medication Sig Dispense Auth. Provider   predniSONE (DELTASONE) 20 MG tablet Take 2 tablets (40 mg total) by mouth daily for 5 days. Take with breakfast or lunch. Avoid NSAIDs (ibuprofen, etc) while taking this medication. 10 tablet Hazel Sams, PA-C   ondansetron (ZOFRAN-ODT) 8 MG disintegrating tablet Take 1 tablet (8 mg total) by mouth every 8 (eight) hours as needed for nausea or vomiting. 20 tablet Hazel Sams, PA-C   oseltamivir (TAMIFLU) 75 MG capsule Take 1 capsule (75 mg total) by mouth every 12 (twelve) hours. 10 capsule Hazel Sams, PA-C      PDMP not reviewed this encounter.   Hazel Sams, PA-C 02/12/21 1505

## 2021-02-12 NOTE — ED Triage Notes (Signed)
Sore throat x 2 days.  Fever, off and on.  States she does have a headache.

## 2021-02-13 LAB — COVID-19, FLU A+B NAA
Influenza A, NAA: NOT DETECTED
Influenza B, NAA: NOT DETECTED
SARS-CoV-2, NAA: DETECTED — AB

## 2021-06-06 NOTE — Progress Notes (Signed)
? ?BP 124/77   Pulse (!) 107   Temp 98.5 ?F (36.9 ?C) (Oral)   Ht 5' 9.39" (1.763 m)   Wt 165 lb 12.8 oz (75.2 kg)   SpO2 98%   BMI 24.21 kg/m?   ? ?Subjective:  ? ? Patient ID: Lisa Craig, female    DOB: 08-05-1995, 26 y.o.   MRN: [id number] ? ?HPI: ?Lisa Craig is a 26 y.o. female ? ?Chief Complaint  ?Patient presents with  ? Establish Care  ? ?Patient presents to clinic to establish care with new PCP.  Introduced to Publishing rights manager role and practice setting.  All questions answered.  Discussed provider/patient relationship and expectations.  Patient reports a history of GAD and depression, migraines, IBS. ? ? ?Patient denies a history of: Hypertension, Elevated Cholesterol, Diabetes, Thyroid problems, Depression, Anxiety, Neurological problems, and Abdominal problems.  ? ?Patient state she has whole body general pain that changes location.  Neck and shoulders are the worst.  Patient has stiffness in her shoulders, wrists and back in the morning.  She has a strong family history Polymyalgia and Rheumatoid arthritis. Legs and feet are sore.  She has fatigue. At times she has a patch of skin that is burning and lasts about an hour or two.  Nothing actually visible on the skin.  ? ?SINUS CONGESTION ?Patient has year round sinus congestion.  She takes Zyrtec daily which does help but not completely.  ? ?DEPRESSION/ANXIETY ?She has also tried duloxetine.  She feels like the Wellbutrin is working much better for her.  ? ? ?  06/07/2021  ?  8:47 AM 04/21/2018  ? 12:25 PM  ?GAD 7 : Generalized Anxiety Score  ?Nervous, Anxious, on Edge 3 2  ?Control/stop worrying 2 1  ?Worry too much - different things 2 1  ?Trouble relaxing 2 0  ?Restless 1 1  ?Easily annoyed or irritable 1 0  ?Afraid - awful might happen 2 2  ?Total GAD 7 Score 13 7  ?Anxiety Difficulty Somewhat difficult Somewhat difficult  ? ? ?Flowsheet Row Office Visit from 06/07/2021 in [facility]  ?PHQ-9 Total Score 10  ? ?  ? ? ? ?Active  Ambulatory Problems  ?  Diagnosis Date Noted  ? Capsulitis of toe 11/01/2012  ? Pain 11/01/2012  ? Ovarian cyst 09/09/2017  ? IBS (irritable bowel syndrome) 09/09/2017  ? Generalized anxiety disorder 09/09/2017  ? History of menorrhagia 04/21/2018  ? Cysts of both ovaries 04/21/2018  ? Neck pain 07/29/2012  ? ?Resolved Ambulatory Problems  ?  Diagnosis Date Noted  ? No Resolved Ambulatory Problems  ? ?Past Medical History:  ?Diagnosis Date  ? Allergy   ? Anxiety   ? Depression   ? Frequent headaches   ? ?Past Surgical History:  ?Procedure Laterality Date  ? FOOT SURGERY Right   ? WISDOM TOOTH EXTRACTION    ? ?Family History  ?Problem Relation Age of Onset  ? Hypertension Mother   ? Cancer Paternal Aunt   ? ? ? ?Review of Systems  ?Constitutional:  Positive for fatigue.  ?HENT:  Positive for congestion.   ?Musculoskeletal:  Positive for arthralgias and myalgias.  ?Psychiatric/Behavioral:  Positive for dysphoric mood. Negative for suicidal ideas. The patient is nervous/anxious.   ? ?Per HPI unless specifically indicated above ? ?   ?Objective:  ?  ?BP 124/77   Pulse (!) 107   Temp 98.5 ?F (36.9 ?C) (Oral)   Ht 5' 9.39" (1.763 m)   Wt 165 lb  12.8 oz (75.2 kg)   SpO2 98%   BMI 24.21 kg/m?   ?Wt Readings from Last 3 Encounters:  ?06/07/21 165 lb 12.8 oz (75.2 kg)  ?05/18/18 150 lb (68 kg)  ?10/21/17 154 lb 12.8 oz (70.2 kg)  ?  ?Physical Exam ?Vitals and nursing note reviewed.  ?Constitutional:   ?   General: She is not in acute distress. ?   Appearance: Normal appearance. She is normal weight. She is not ill-appearing, toxic-appearing or diaphoretic.  ?HENT:  ?   Head: Normocephalic.  ?   Right Ear: External ear normal.  ?   Left Ear: External ear normal.  ?   Nose: Nose normal.  ?   Mouth/Throat:  ?   Mouth: Mucous membranes are moist.  ?   Pharynx: Oropharynx is clear.  ?Eyes:  ?   General:     ?   Right eye: No discharge.     ?   Left eye: No discharge.  ?   Extraocular Movements: Extraocular movements intact.   ?   Conjunctiva/sclera: Conjunctivae normal.  ?   Pupils: Pupils are equal, round, and reactive to light.  ?Cardiovascular:  ?   Rate and Rhythm: Normal rate and regular rhythm.  ?   Heart sounds: No murmur heard. ?Pulmonary:  ?   Effort: Pulmonary effort is normal. No respiratory distress.  ?   Breath sounds: Normal breath sounds. No wheezing or rales.  ?Musculoskeletal:  ?   Cervical back: Normal range of motion and neck supple.  ?Skin: ?   General: Skin is warm and dry.  ?   Capillary Refill: Capillary refill takes less than 2 seconds.  ?Neurological:  ?   General: No focal deficit present.  ?   Mental Status: She is alert and oriented to person, place, and time. Mental status is at baseline.  ?Psychiatric:     ?   Mood and Affect: Mood normal.     ?   Behavior: Behavior normal.     ?   Thought Content: Thought content normal.     ?   Judgment: Judgment normal.  ? ? ?Results for orders placed or performed during the hospital encounter of 02/12/21  ?Covid-19, Flu A+B ([facility])  ? Specimen: Nasopharyngeal  ? Naso  ?Result Value Ref Range  ? SARS-CoV-2, NAA Detected (A) Not Detected  ? Influenza A, NAA Not Detected Not Detected  ? Influenza B, NAA Not Detected Not Detected  ? Test Information: Comment   ? ?   ?Assessment & Plan:  ? ?Problem List Items Addressed This Visit   ?None ?  ? ?Follow up plan: ?No follow-ups on file. ? ? ? ? ? ?

## 2021-06-07 ENCOUNTER — Encounter: Payer: Self-pay | Admitting: Nurse Practitioner

## 2021-06-07 ENCOUNTER — Ambulatory Visit (INDEPENDENT_AMBULATORY_CARE_PROVIDER_SITE_OTHER): Admitting: Nurse Practitioner

## 2021-06-07 ENCOUNTER — Other Ambulatory Visit: Payer: Self-pay | Admitting: Nurse Practitioner

## 2021-06-07 VITALS — BP 124/77 | HR 107 | Temp 98.5°F | Ht 69.39 in | Wt 165.8 lb

## 2021-06-07 DIAGNOSIS — F411 Generalized anxiety disorder: Secondary | ICD-10-CM

## 2021-06-07 DIAGNOSIS — R0981 Nasal congestion: Secondary | ICD-10-CM | POA: Diagnosis not present

## 2021-06-07 DIAGNOSIS — M2560 Stiffness of unspecified joint, not elsewhere classified: Secondary | ICD-10-CM | POA: Diagnosis not present

## 2021-06-07 DIAGNOSIS — R5383 Other fatigue: Secondary | ICD-10-CM | POA: Diagnosis not present

## 2021-06-07 MED ORDER — MONTELUKAST SODIUM 10 MG PO TABS: 10.0000 mg | ORAL_TABLET | Freq: Every day | ORAL | 1 refills | Status: DC

## 2021-06-07 NOTE — Assessment & Plan Note (Signed)
Chronic. Ongoing x many years.  Does take zyrtec daily which helps.  Will add Singulair.  Discussed side effects including worsening depression and symptoms to monitor for.  Will refer to Allergy if symptoms not improved.  Follow up in 1 month for reevaluation. ?

## 2021-06-07 NOTE — Assessment & Plan Note (Signed)
Chronic. Improved with Wellbutrin.  Discussed possibly starting Lexapro to see if symptoms continue to improve.  Will rediscuss at next visit. ?

## 2021-06-10 NOTE — Telephone Encounter (Signed)
med was refilled 06/07/21 ? ?Requested Prescriptions  ?Refused Prescriptions Disp Refills  ?? montelukast (SINGULAIR) 10 MG tablet [Pharmacy Med Name: MONTELUKAST 10MG  TABLETS] 90 tablet   ?  Sig: TAKE 1 TABLET(10 MG) BY MOUTH AT BEDTIME  ?  ? Pulmonology:  Leukotriene Inhibitors Passed - 06/07/2021  9:59 AM  ?  ?  Passed - Valid encounter within last 12 months  ?  Recent Outpatient Visits   ?      ? 3 days ago Morning stiffness of joints  ? [place], NP  ? 3 years ago Generalized anxiety disorder  ? [place] Delsa Grana, PA-C  ? 3 years ago Generalized anxiety disorder  ? [place], [place], PA-C  ? 3 years ago Encounter for medical examination to establish care  ? [place], [place], PA-C  ?  ?  ?Future Appointments   ?        ? In 1 month Jon Billings, NP [facility]  ?  ? ?  ?  ?  ? ? ?

## 2021-06-12 LAB — EHRLICHIA ANTIBODY PANEL
E. Chaffeensis (HME) IgM Titer: NEGATIVE
E.Chaffeensis (HME) IgG: NEGATIVE
HGE IgG Titer: NEGATIVE
HGE IgM Titer: NEGATIVE

## 2021-06-12 LAB — COMPREHENSIVE METABOLIC PANEL
ALT: 12 IU/L (ref 0–32)
AST: 15 IU/L (ref 0–40)
Albumin/Globulin Ratio: 2.1 (ref 1.2–2.2)
Albumin: 4.6 g/dL (ref 3.9–5.0)
Alkaline Phosphatase: 88 IU/L (ref 44–121)
BUN/Creatinine Ratio: 15 (ref 9–23)
BUN: 15 mg/dL (ref 6–20)
Bilirubin Total: 0.3 mg/dL (ref 0.0–1.2)
CO2: 22 mmol/L (ref 20–29)
Calcium: 9.5 mg/dL (ref 8.7–10.2)
Chloride: 105 mmol/L (ref 96–106)
Creatinine, Ser: 1 mg/dL (ref 0.57–1.00)
Globulin, Total: 2.2 g/dL (ref 1.5–4.5)
Glucose: 78 mg/dL (ref 70–99)
Potassium: 4.2 mmol/L (ref 3.5–5.2)
Sodium: 143 mmol/L (ref 134–144)
Total Protein: 6.8 g/dL (ref 6.0–8.5)
eGFR: 79 mL/min/{1.73_m2} (ref >59)

## 2021-06-12 LAB — CBC WITH DIFFERENTIAL/PLATELET
Basophils Absolute: 0 10*3/uL (ref 0.0–0.2)
Basos: 1 %
EOS (ABSOLUTE): 0 10*3/uL (ref 0.0–0.4)
Eos: 1 %
Hematocrit: 42 % (ref 34.0–46.6)
Hemoglobin: 13.9 g/dL (ref 11.1–15.9)
Immature Grans (Abs): 0 10*3/uL (ref 0.0–0.1)
Immature Granulocytes: 0 %
Lymphocytes Absolute: 2.4 10*3/uL (ref 0.7–3.1)
Lymphs: 51 %
MCH: 29.1 pg (ref 26.6–33.0)
MCHC: 33.1 g/dL (ref 31.5–35.7)
MCV: 88 fL (ref 79–97)
Monocytes Absolute: 0.3 10*3/uL (ref 0.1–0.9)
Monocytes: 6 %
Neutrophils Absolute: 1.9 10*3/uL (ref 1.4–7.0)
Neutrophils: 41 %
Platelets: 322 10*3/uL (ref 150–450)
RBC: 4.78 x10E6/uL (ref 3.77–5.28)
RDW: 11.9 % (ref 11.7–15.4)
WBC: 4.7 10*3/uL (ref 3.4–10.8)

## 2021-06-12 LAB — ANA+ENA+DNA/DS+ANTICH+CENTR
ANA Titer 1: NEGATIVE
Anti JO-1: <0.2 AI (ref 0.0–0.9)
Centromere Ab Screen: <0.2 AI (ref 0.0–0.9)
Chromatin Ab SerPl-aCnc: <0.2 AI (ref 0.0–0.9)
ENA RNP Ab: <0.2 AI (ref 0.0–0.9)
ENA SM Ab Ser-aCnc: <0.2 AI (ref 0.0–0.9)
ENA SSA (RO) Ab: 0.2 AI (ref 0.0–0.9)
ENA SSB (LA) Ab: <0.2 AI (ref 0.0–0.9)
Scleroderma (Scl-70) (ENA) Antibody, IgG: <0.2 AI (ref 0.0–0.9)
dsDNA Ab: <1 IU/mL (ref 0–9)

## 2021-06-12 LAB — BABESIA MICROTI ANTIBODY PANEL
Babesia microti IgG: <1:10 {titer}
Babesia microti IgM: <1:10 {titer}

## 2021-06-12 LAB — ROCKY MTN SPOTTED FVR ABS PNL(IGG+IGM)
RMSF IgG: NEGATIVE
RMSF IgM: 0.32 index (ref 0.00–0.89)

## 2021-06-12 LAB — RHEUMATOID FACTOR: Rheumatoid fact SerPl-aCnc: <10 IU/mL (ref <14.0)

## 2021-06-12 LAB — VITAMIN D 25 HYDROXY (VIT D DEFICIENCY, FRACTURES): Vit D, 25-Hydroxy: 25.1 ng/mL — ABNORMAL LOW (ref 30.0–100.0)

## 2021-06-12 LAB — URIC ACID: Uric Acid: 4.5 mg/dL (ref 2.6–6.2)

## 2021-06-12 LAB — ANA: Anti Nuclear Antibody (ANA): NEGATIVE

## 2021-06-12 LAB — CK: Total CK: 88 U/L (ref 32–182)

## 2021-06-12 NOTE — Progress Notes (Signed)
Please let patient know that her lab work shows that her Vitamin D is low.  I recommend she start vitamin D 1000IU daily to help with this.  There is no evidence of an autoimmune disorder at this time.  However, we will continue to check in the future due to her strong family history.  Please let me know if she has any questions.

## 2021-07-10 NOTE — Progress Notes (Signed)
BP 125/77   Pulse 86   Temp 98.6 F (37 C) (Oral)   Ht 5' 9.69" (1.77 m)   Wt 168 lb 6.4 oz (76.4 kg)   SpO2 99%   BMI 24.38 kg/m    Subjective:    Patient ID: Lisa Craig, female    DOB: 01-10-1996, 26 y.o.   MRN: [id number]  HPI: Lisa Craig is a 26 y.o. female presenting on 07/11/2021 for comprehensive medical examination. Current medical complaints include: Refill of Wellbutrin   She currently lives with: Menopausal Symptoms: no  DEPRESSION Patient states her depression is controlled.  She feels like her anxiety is not as controlled.  She is wondering if she can take something for situational control of her anxiety.  Denies SI.   Depression Screen done today and results listed below:     07/11/2021    9:37 AM 06/07/2021    8:47 AM 10/21/2017   10:07 AM 09/09/2017    3:01 PM  Depression screen PHQ 2/9  Decreased Interest 2 2 1 2   Down, Depressed, Hopeless 2 1 1 2   PHQ - 2 Score 4 3 2 4   Altered sleeping 2 3 2 2   Tired, decreased energy 3 2 2 3   Change in appetite 1 0 0 1  Feeling bad or failure about yourself  1 1 1 2   Trouble concentrating 2 1 1 3   Moving slowly or fidgety/restless 0 0 0 0  Suicidal thoughts 0 0 0 0  PHQ-9 Score 13 10 8 15   Difficult doing work/chores Somewhat difficult Somewhat difficult Somewhat difficult Somewhat difficult    The patient does not have a history of falls. I did complete a risk assessment for falls. A plan of care for falls was documented.   Past Medical History:  Past Medical History:  Diagnosis Date  . Allergy   . Anxiety   . Depression   . Frequent headaches     Surgical History:  Past Surgical History:  Procedure Laterality Date  . FOOT SURGERY Right   . WISDOM TOOTH EXTRACTION      Medications:  Current Outpatient Medications on File Prior to Visit  Medication Sig  . cetirizine (ZYRTEC) 10 MG tablet Take 10 mg by mouth daily.  SIMPESSE 0.15-0.03 &0.01 MG tablet TAKE 1 TABLET BY MOUTH DAILY  . Vitamin D,  Cholecalciferol, 25 MCG (1000 UT) TABS Take 1,000 mcg by mouth daily.   No current facility-administered medications on file prior to visit.    Allergies:  Allergies  Allergen Reactions  . Tramadol     Other reaction(s): Dry Mouth, Headache Dry mouth and heartburn   . Codeine Nausea Only and Nausea And Vomiting    Social History:  Social History   Socioeconomic History  . Marital status: Married    Spouse name: Not on file  . Number of children: Not on file  . Years of education: Not on file  . Highest education level: Not on file  Occupational History  . Not on file  Tobacco Use  . Smoking status: Never  . Smokeless tobacco: Never  Vaping Use  . Vaping Use: Never used  Substance and Sexual Activity  . Alcohol use: Yes    Comment: occ. wine  . Drug use: No  . Sexual activity: Yes    Birth control/protection: Pill  Other Topics Concern  . Not on file  Social History Narrative  . Not on file   Social Determinants of Health   Financial  Resource Strain: Not on file  Food Insecurity: Not on file  Transportation Needs: Not on file  Physical Activity: Not on file  Stress: Not on file  Social Connections: Not on file  Intimate Partner Violence: Not on file   Social History   Tobacco Use  Smoking Status Never  Smokeless Tobacco Never   Social History   Substance and Sexual Activity  Alcohol Use Yes   Comment: occ. wine    Family History:  Family History  Problem Relation Age of Onset  . Hypertension Mother   . Cancer Paternal Aunt     Past medical history, surgical history, medications, allergies, family history and social history reviewed with patient today and changes made to appropriate areas of the chart.   Review of Systems  Eyes:  Negative for blurred vision and double vision.  Respiratory:  Negative for shortness of breath.   Cardiovascular:  Negative for chest pain, palpitations and leg swelling.  Neurological:  Negative for dizziness and  headaches.  Psychiatric/Behavioral:  Negative for depression. The patient is nervous/anxious.    All other ROS negative except what is listed above and in the HPI.      Objective:    BP 125/77   Pulse 86   Temp 98.6 F (37 C) (Oral)   Ht 5' 9.69" (1.77 m)   Wt 168 lb 6.4 oz (76.4 kg)   SpO2 99%   BMI 24.38 kg/m   Wt Readings from Last 3 Encounters:  07/11/21 168 lb 6.4 oz (76.4 kg)  06/07/21 165 lb 12.8 oz (75.2 kg)  05/18/18 150 lb (68 kg)    Physical Exam Vitals and nursing note reviewed.  Constitutional:      General: She is awake. She is not in acute distress.    Appearance: Normal appearance. She is well-developed and normal weight. She is not ill-appearing.  HENT:     Head: Normocephalic and atraumatic.     Right Ear: Hearing, tympanic membrane, ear canal and external ear normal. No drainage.     Left Ear: Hearing, tympanic membrane, ear canal and external ear normal. No drainage.     Nose: Nose normal.     Right Sinus: No maxillary sinus tenderness or frontal sinus tenderness.     Left Sinus: No maxillary sinus tenderness or frontal sinus tenderness.     Mouth/Throat:     Mouth: Mucous membranes are moist.     Pharynx: Oropharynx is clear. Uvula midline. No pharyngeal swelling, oropharyngeal exudate or posterior oropharyngeal erythema.  Eyes:     General: Lids are normal.        Right eye: No discharge.        Left eye: No discharge.     Extraocular Movements: Extraocular movements intact.     Conjunctiva/sclera: Conjunctivae normal.     Pupils: Pupils are equal, round, and reactive to light.     Visual Fields: Right eye visual fields normal and left eye visual fields normal.  Neck:     Thyroid: No thyromegaly.     Vascular: No carotid bruit.     Trachea: Trachea normal.  Cardiovascular:     Rate and Rhythm: Normal rate and regular rhythm.     Heart sounds: Normal heart sounds. No murmur heard.    No gallop.  Pulmonary:     Effort: Pulmonary effort is  normal. No accessory muscle usage or respiratory distress.     Breath sounds: Normal breath sounds.  Chest:  Breasts:  Right: Normal.     Left: Normal.  Abdominal:     General: Bowel sounds are normal.     Palpations: Abdomen is soft. There is no hepatomegaly or splenomegaly.     Tenderness: There is no abdominal tenderness.  Musculoskeletal:        General: Normal range of motion.     Cervical back: Normal range of motion and neck supple.     Right lower leg: No edema.     Left lower leg: No edema.  Lymphadenopathy:     Head:     Right side of head: No submental, submandibular, tonsillar, preauricular or posterior auricular adenopathy.     Left side of head: No submental, submandibular, tonsillar, preauricular or posterior auricular adenopathy.     Cervical: No cervical adenopathy.     Upper Body:     Right upper body: No supraclavicular, axillary or pectoral adenopathy.     Left upper body: No supraclavicular, axillary or pectoral adenopathy.  Skin:    General: Skin is warm and dry.     Capillary Refill: Capillary refill takes less than 2 seconds.     Findings: No rash.  Neurological:     Mental Status: She is alert and oriented to person, place, and time.     Gait: Gait is intact.     Deep Tendon Reflexes: Reflexes are normal and symmetric.     Reflex Scores:      Brachioradialis reflexes are 2+ on the right side and 2+ on the left side.      Patellar reflexes are 2+ on the right side and 2+ on the left side. Psychiatric:        Attention and Perception: Attention normal.        Mood and Affect: Mood normal.        Speech: Speech normal.        Behavior: Behavior normal. Behavior is cooperative.        Thought Content: Thought content normal.        Judgment: Judgment normal.    Results for orders placed or performed in visit on 06/07/21  HM PAP SMEAR  Result Value Ref Range   HM Pap smear See results scanned in chart       Assessment & Plan:   Problem List  Items Addressed This Visit       Other   Generalized anxiety disorder    Chronic. Not well controlled due to stress at work.  Will add Buspar PRN.  Discussed side effects and benefits of medication during visit.  Follow up in 2 months for reevaluation.      Relevant Medications   buPROPion (WELLBUTRIN XL) 300 MG 24 hr tablet   busPIRone (BUSPAR) 5 MG tablet   Depression    Chronic.  Controlled.  Continue with current medication regimen of Wellbutrin daily.  Labs ordered today.  Return to clinic in 2 months for reevaluation.  Call sooner if concerns arise.        Relevant Medications   buPROPion (WELLBUTRIN XL) 300 MG 24 hr tablet   busPIRone (BUSPAR) 5 MG tablet   Other Visit Diagnoses     Annual physical exam    -  Primary   Health maintenance reviewed during visit today. Labs ordered. PAP up to date. Believes she received her HPV.   Relevant Orders   CBC with Differential/Platelet   Comprehensive metabolic panel   Lipid panel   TSH   Urinalysis, Routine w reflex  microscopic   Screening for ischemic heart disease       Relevant Orders   Lipid panel   Encounter for hepatitis C screening test for low risk patient       Relevant Orders   Hepatitis C Antibody   Screening for HIV (human immunodeficiency virus)       Relevant Orders   HIV Antibody (routine testing w rflx)        Follow up plan: Return in about 2 months (around 09/10/2021) for Depression/Anxiety FU.   LABORATORY TESTING:  - Pap smear: up to date  IMMUNIZATIONS:   - Tdap: Tetanus vaccination status reviewed: last tetanus booster within 10 years. - Influenza: Postponed to flu season - Pneumovax: Not applicable - Prevnar: Not applicable - COVID: Not applicable - HPV: Up to date - Shingrix vaccine: Not applicable  SCREENING: -Mammogram: Not applicable  - Colonoscopy: Not applicable  - Bone Density: Not applicable  -Hearing Test: Not applicable  -Spirometry: Not applicable   PATIENT COUNSELING:    Advised to take 1 mg of folate supplement per day if capable of pregnancy.   Sexuality: Discussed sexually transmitted diseases, partner selection, use of condoms, avoidance of unintended pregnancy  and contraceptive alternatives.   Advised to avoid cigarette smoking.  I discussed with the patient that most people either abstain from alcohol or drink within safe limits (<=14/week and <=4 drinks/occasion for males, <=7/weeks and <= 3 drinks/occasion for females) and that the risk for alcohol disorders and other health effects rises proportionally with the number of drinks per week and how often a drinker exceeds daily limits.  Discussed cessation/primary prevention of drug use and availability of treatment for abuse.   Diet: Encouraged to adjust caloric intake to maintain  or achieve ideal body weight, to reduce intake of dietary saturated fat and total fat, to limit sodium intake by avoiding high sodium foods and not adding table salt, and to maintain adequate dietary potassium and calcium preferably from fresh fruits, vegetables, and low-fat dairy products.    stressed the importance of regular exercise  Injury prevention: Discussed safety belts, safety helmets, smoke detector, smoking near bedding or upholstery.   Dental health: Discussed importance of regular tooth brushing, flossing, and dental visits.    NEXT PREVENTATIVE PHYSICAL DUE IN 1 YEAR. Return in about 2 months (around 09/10/2021) for Depression/Anxiety FU.

## 2021-07-11 ENCOUNTER — Ambulatory Visit (INDEPENDENT_AMBULATORY_CARE_PROVIDER_SITE_OTHER): Admitting: Nurse Practitioner

## 2021-07-11 ENCOUNTER — Encounter: Payer: Self-pay | Admitting: Nurse Practitioner

## 2021-07-11 VITALS — BP 125/77 | HR 86 | Temp 98.6°F | Ht 69.69 in | Wt 168.4 lb

## 2021-07-11 DIAGNOSIS — Z136 Encounter for screening for cardiovascular disorders: Secondary | ICD-10-CM | POA: Diagnosis not present

## 2021-07-11 DIAGNOSIS — Z0000 Encounter for general adult medical examination without abnormal findings: Secondary | ICD-10-CM | POA: Diagnosis not present

## 2021-07-11 DIAGNOSIS — F32A Depression, unspecified: Secondary | ICD-10-CM

## 2021-07-11 DIAGNOSIS — Z114 Encounter for screening for human immunodeficiency virus [HIV]: Secondary | ICD-10-CM

## 2021-07-11 DIAGNOSIS — F419 Anxiety disorder, unspecified: Secondary | ICD-10-CM | POA: Insufficient documentation

## 2021-07-11 DIAGNOSIS — F411 Generalized anxiety disorder: Secondary | ICD-10-CM

## 2021-07-11 DIAGNOSIS — Z1159 Encounter for screening for other viral diseases: Secondary | ICD-10-CM

## 2021-07-11 LAB — URINALYSIS, ROUTINE W REFLEX MICROSCOPIC
Bilirubin, UA: NEGATIVE
Glucose, UA: NEGATIVE
Ketones, UA: NEGATIVE
Leukocytes,UA: NEGATIVE
Nitrite, UA: NEGATIVE
RBC, UA: NEGATIVE
Specific Gravity, UA: 1.015 (ref 1.005–1.030)
Urobilinogen, Ur: 0.2 mg/dL (ref 0.2–1.0)
pH, UA: 8.5 — ABNORMAL HIGH (ref 5.0–7.5)

## 2021-07-11 MED ORDER — BUPROPION HCL ER (XL) 300 MG PO TB24: 300.0000 mg | ORAL_TABLET | Freq: Every day | ORAL | 1 refills | Status: DC

## 2021-07-11 MED ORDER — MONTELUKAST SODIUM 10 MG PO TABS: 10.0000 mg | ORAL_TABLET | Freq: Every day | ORAL | 1 refills | Status: DC

## 2021-07-11 MED ORDER — BUSPIRONE HCL 5 MG PO TABS: 5.0000 mg | ORAL_TABLET | Freq: Two times a day (BID) | ORAL | 1 refills | Status: DC | PRN

## 2021-07-11 NOTE — Assessment & Plan Note (Signed)
Chronic. Not well controlled due to stress at work.  Will add Buspar PRN.  Discussed side effects and benefits of medication during visit.  Follow up in 2 months for reevaluation.

## 2021-07-11 NOTE — Assessment & Plan Note (Signed)
Chronic.  Controlled.  Continue with current medication regimen of Wellbutrin daily.  Labs ordered today.  Return to clinic in 2 months for reevaluation.  Call sooner if concerns arise.

## 2021-07-12 LAB — COMPREHENSIVE METABOLIC PANEL
ALT: 12 IU/L (ref 0–32)
AST: 14 IU/L (ref 0–40)
Albumin/Globulin Ratio: 1.7 (ref 1.2–2.2)
Albumin: 4.3 g/dL (ref 3.9–5.0)
Alkaline Phosphatase: 83 IU/L (ref 44–121)
BUN/Creatinine Ratio: 11 (ref 9–23)
BUN: 11 mg/dL (ref 6–20)
Bilirubin Total: 0.2 mg/dL (ref 0.0–1.2)
CO2: 24 mmol/L (ref 20–29)
Calcium: 9.5 mg/dL (ref 8.7–10.2)
Chloride: 104 mmol/L (ref 96–106)
Creatinine, Ser: 0.97 mg/dL (ref 0.57–1.00)
Globulin, Total: 2.5 g/dL (ref 1.5–4.5)
Glucose: 83 mg/dL (ref 70–99)
Potassium: 4.3 mmol/L (ref 3.5–5.2)
Sodium: 138 mmol/L (ref 134–144)
Total Protein: 6.8 g/dL (ref 6.0–8.5)
eGFR: 82 mL/min/{1.73_m2} (ref >59)

## 2021-07-12 LAB — CBC WITH DIFFERENTIAL/PLATELET
Basophils Absolute: 0 10*3/uL (ref 0.0–0.2)
Basos: 1 %
EOS (ABSOLUTE): 0.1 10*3/uL (ref 0.0–0.4)
Eos: 2 %
Hematocrit: 40.3 % (ref 34.0–46.6)
Hemoglobin: 13.4 g/dL (ref 11.1–15.9)
Immature Grans (Abs): 0 10*3/uL (ref 0.0–0.1)
Immature Granulocytes: 0 %
Lymphocytes Absolute: 2.5 10*3/uL (ref 0.7–3.1)
Lymphs: 46 %
MCH: 29.8 pg (ref 26.6–33.0)
MCHC: 33.3 g/dL (ref 31.5–35.7)
MCV: 90 fL (ref 79–97)
Monocytes Absolute: 0.4 10*3/uL (ref 0.1–0.9)
Monocytes: 6 %
Neutrophils Absolute: 2.6 10*3/uL (ref 1.4–7.0)
Neutrophils: 45 %
Platelets: 316 10*3/uL (ref 150–450)
RBC: 4.5 x10E6/uL (ref 3.77–5.28)
RDW: 11.9 % (ref 11.7–15.4)
WBC: 5.6 10*3/uL (ref 3.4–10.8)

## 2021-07-12 LAB — LIPID PANEL
Chol/HDL Ratio: 3.9 ratio (ref 0.0–4.4)
Cholesterol, Total: 185 mg/dL (ref 100–199)
HDL: 47 mg/dL (ref >39)
LDL Chol Calc (NIH): 121 mg/dL — ABNORMAL HIGH (ref 0–99)
Triglycerides: 94 mg/dL (ref 0–149)
VLDL Cholesterol Cal: 17 mg/dL (ref 5–40)

## 2021-07-12 LAB — HIV ANTIBODY (ROUTINE TESTING W REFLEX): HIV Screen 4th Generation wRfx: NONREACTIVE

## 2021-07-12 LAB — TSH: TSH: 2.34 u[IU]/mL (ref 0.450–4.500)

## 2021-07-12 LAB — HEPATITIS C ANTIBODY: Hep C Virus Ab: NONREACTIVE

## 2021-09-02 ENCOUNTER — Encounter: Payer: Self-pay | Admitting: Nurse Practitioner

## 2021-09-05 ENCOUNTER — Ambulatory Visit: Payer: Self-pay | Admitting: Nurse Practitioner

## 2021-09-13 ENCOUNTER — Ambulatory Visit: Admitting: Nurse Practitioner

## 2021-09-26 ENCOUNTER — Ambulatory Visit: Admitting: Nurse Practitioner

## 2021-10-16 ENCOUNTER — Encounter: Payer: Self-pay | Admitting: Nurse Practitioner

## 2021-10-16 ENCOUNTER — Telehealth: Admitting: Nurse Practitioner

## 2021-10-16 ENCOUNTER — Telehealth: Payer: Self-pay | Admitting: Nurse Practitioner

## 2021-10-16 DIAGNOSIS — F32A Depression, unspecified: Secondary | ICD-10-CM | POA: Diagnosis not present

## 2021-10-16 DIAGNOSIS — R0981 Nasal congestion: Secondary | ICD-10-CM | POA: Diagnosis not present

## 2021-10-16 DIAGNOSIS — F419 Anxiety disorder, unspecified: Secondary | ICD-10-CM | POA: Diagnosis not present

## 2021-10-16 DIAGNOSIS — R52 Pain, unspecified: Secondary | ICD-10-CM

## 2021-10-16 NOTE — Assessment & Plan Note (Signed)
Chronic. Ongoing.  Buspar is helping with anxiety.  Patient is working with a Transport planner.  Would like to see if therapy helps before adding new medications.  Follow up in 3 months.  Call sooner if concerns arise.

## 2021-10-16 NOTE — Assessment & Plan Note (Signed)
Chronic. Ongoing.  Buspar is helping with anxiety.  Patient is working with a therapist.  Would like to see if therapy helps before adding new medications.  Follow up in 3 months.  Call sooner if concerns arise.  

## 2021-10-16 NOTE — Telephone Encounter (Signed)
[facility] would like referral placed for review.

## 2021-10-16 NOTE — Progress Notes (Signed)
There were no vitals taken for this visit.   Subjective:    Patient ID: Lisa Craig, female    DOB: 1995/11/10, 26 y.o.   MRN: [id number]  HPI: Lisa Craig is a 26 y.o. female  Chief Complaint  Patient presents with   Cough    Cough, ST, sinus pressure, post nasal drainage ongoing x a long time. Patient reports these are the same symptoms she has during seasonal allergies. Negative at home covid test.   Would like a referral to an allergist    CONGESTION Normal allergy symptoms.  Cough, ST, sinus pressure, post nasal drainage ongoing x a long time. Patient reports these are the same symptoms she has during seasonal allergies. Negative at home covid test. She has sinus drainage,   DEPRESSION/ANXIETY Feels like the buspar works for anxiety attacks.  Feels like her other symptoms are about the same.  She is getting back in with a therapist.  They have had their first appt. She feels like she would like to give therapy a try before changing medications.  Denies SI.   Flowsheet Row Video Visit from 10/16/2021 in [place]  PHQ-9 Total Score 11         10/16/2021    3:05 PM 07/11/2021    9:38 AM 06/07/2021    8:47 AM 04/21/2018   12:25 PM  GAD 7 : Generalized Anxiety Score  Nervous, Anxious, on Edge _0 Control/stop worrying _1 Worry too much - different things _2 Trouble relaxing _3 0  Restless _4 Easily annoyed or irritable 0 2 1 0  Afraid - awful might happen _5 Total GAD 7 Score _6 Anxiety Difficulty Somewhat difficult Very difficult Somewhat difficult Somewhat difficult     Relevant past medical, surgical, family and social history reviewed and updated as indicated. Interim medical history since our last visit reviewed. Allergies and medications reviewed and updated.  Review of Systems  Psychiatric/Behavioral:  Positive for dysphoric mood. Negative for suicidal ideas. The patient is nervous/anxious.     Per HPI  unless specifically indicated above     Objective:    There were no vitals taken for this visit.  Wt Readings from Last 3 Encounters:  07/11/21 168 lb 6.4 oz (76.4 kg)  06/07/21 165 lb 12.8 oz (75.2 kg)  05/18/18 150 lb (68 kg)    Physical Exam Vitals and nursing note reviewed.  HENT:     Head: Normocephalic.     Right Ear: Hearing normal.     Left Ear: Hearing normal.     Nose: Nose normal.  Eyes:     Pupils: Pupils are equal, round, and reactive to light.  Pulmonary:     Effort: Pulmonary effort is normal. No respiratory distress.  Neurological:     Mental Status: She is alert.  Psychiatric:        Mood and Affect: Mood normal.        Behavior: Behavior normal.        Thought Content: Thought content normal.        Judgment: Judgment normal.     Results for orders placed or performed in visit on 07/11/21  CBC with Differential/Platelet  Result Value Ref Range   WBC 5.6 3.4 - 10.8 x10E3/uL   RBC 4.50 3.77 - 5.28 x10E6/uL   Hemoglobin 13.4 11.1 - 15.9 g/dL  Hematocrit 40.3 34.0 - 46.6 %   MCV 90 79 - 97 fL   MCH 29.8 26.6 - 33.0 pg   MCHC 33.3 31.5 - 35.7 g/dL   RDW 11.9 11.7 - 15.4 %   Platelets 316 150 - 450 x10E3/uL   Neutrophils 45 Not Estab. %   Lymphs 46 Not Estab. %   Monocytes 6 Not Estab. %   Eos 2 Not Estab. %   Basos 1 Not Estab. %   Neutrophils Absolute 2.6 1.4 - 7.0 x10E3/uL   Lymphocytes Absolute 2.5 0.7 - 3.1 x10E3/uL   Monocytes Absolute 0.4 0.1 - 0.9 x10E3/uL   EOS (ABSOLUTE) 0.1 0.0 - 0.4 x10E3/uL   Basophils Absolute 0.0 0.0 - 0.2 x10E3/uL   Immature Granulocytes 0 Not Estab. %   Immature Grans (Abs) 0.0 0.0 - 0.1 x10E3/uL  Comprehensive metabolic panel  Result Value Ref Range   Glucose 83 70 - 99 mg/dL   BUN 11 6 - 20 mg/dL   Creatinine, Ser 0.97 0.57 - 1.00 mg/dL   eGFR 82 >59 mL/min/1.73   BUN/Creatinine Ratio 11 9 - 23   Sodium 138 134 - 144 mmol/L   Potassium 4.3 3.5 - 5.2 mmol/L   Chloride 104 96 - 106 mmol/L   CO2 24 20 - 29  mmol/L   Calcium 9.5 8.7 - 10.2 mg/dL   Total Protein 6.8 6.0 - 8.5 g/dL   Albumin 4.3 3.9 - 5.0 g/dL   Globulin, Total 2.5 1.5 - 4.5 g/dL   Albumin/Globulin Ratio 1.7 1.2 - 2.2   Bilirubin Total 0.2 0.0 - 1.2 mg/dL   Alkaline Phosphatase 83 44 - 121 IU/L   AST 14 0 - 40 IU/L   ALT 12 0 - 32 IU/L  Lipid panel  Result Value Ref Range   Cholesterol, Total 185 100 - 199 mg/dL   Triglycerides 94 0 - 149 mg/dL   HDL 47 >39 mg/dL   VLDL Cholesterol Cal 17 5 - 40 mg/dL   LDL Chol Calc (NIH) 121 (H) 0 - 99 mg/dL   Chol/HDL Ratio 3.9 0.0 - 4.4 ratio  TSH  Result Value Ref Range   TSH 2.340 0.450 - 4.500 uIU/mL  Urinalysis, Routine w reflex microscopic  Result Value Ref Range   Specific Gravity, UA 1.015 1.005 - 1.030   pH, UA 8.5 (H) 5.0 - 7.5   Color, UA Yellow Yellow   Appearance Ur Cloudy (A) Clear   Leukocytes,UA Negative Negative   Protein,UA Trace (A) Negative/Trace   Glucose, UA Negative Negative   Ketones, UA Negative Negative   RBC, UA Negative Negative   Bilirubin, UA Negative Negative   Urobilinogen, Ur 0.2 0.2 - 1.0 mg/dL   Nitrite, UA Negative Negative  HIV Antibody (routine testing w rflx)  Result Value Ref Range   HIV Screen 4th Generation wRfx Non Reactive Non Reactive  Hepatitis C Antibody  Result Value Ref Range   Hep C Virus Ab Non Reactive Non Reactive      Assessment & Plan:   Problem List Items Addressed This Visit       Other   Anxiety    Chronic. Ongoing.  Buspar is helping with anxiety.  Patient is working with a Transport planner.  Would like to see if therapy helps before adding new medications.  Follow up in 3 months.  Call sooner if concerns arise.       Depression    Chronic. Ongoing.  Buspar is helping with anxiety.  Patient  is working with a Transport planner.  Would like to see if therapy helps before adding new medications.  Follow up in 3 months.  Call sooner if concerns arise.       Other Visit Diagnoses     Chronic nasal congestion    -  Primary    Referral placed for patient to see Allergy.   Relevant Orders   Ambulatory referral to Allergy        Follow up plan: Return in about 3 months (around 01/15/2022).   This visit was completed via MyChart due to the restrictions of the COVID-19 pandemic. All issues as above were discussed and addressed. Physical exam was done as above through visual confirmation on MyChart. If it was felt that the patient should be evaluated in the office, they were directed there. The patient verbally consented to this visit. Location of the patient: Home Location of the provider: Office Those involved with this call:  Provider: Jon Billings, NP CMA: Valinda Hoar, [place] Desk/Registration: Lynnell Catalan This encounter was conducted via video.  I spent 20 dedicated to the care of this patient on the date of this encounter to include previsit review of symptoms, follow up and plan of care, face to face time with the patient, and post visit ordering of testing.

## 2021-10-16 NOTE — Addendum Note (Signed)
Addended by: Jon Billings on: 10/16/2021 04:27 PM   Modules accepted: Orders

## 2021-10-16 NOTE — Telephone Encounter (Signed)
Can we call over to [facility] (Dr. Shellia Cleverly office) and ask if they would see this patient.  She has chronic joint pain. Mom is a patient at their office being treated for an autoimmune disease.

## 2021-10-17 NOTE — Progress Notes (Signed)
Patient scheduled.

## 2021-10-17 NOTE — Telephone Encounter (Signed)
Pt.notified

## 2021-10-24 ENCOUNTER — Other Ambulatory Visit: Payer: Self-pay | Admitting: Nurse Practitioner

## 2021-10-24 MED ORDER — BUPROPION HCL ER (XL) 300 MG PO TB24: 300.0000 mg | ORAL_TABLET | Freq: Every day | ORAL | 1 refills | Status: DC

## 2021-10-24 NOTE — Telephone Encounter (Signed)
Requested Prescriptions  Pending Prescriptions Disp Refills  . buPROPion (WELLBUTRIN XL) 300 MG 24 hr tablet 90 tablet 1    Sig: Take 1 tablet (300 mg total) by mouth daily.     Psychiatry: Antidepressants - bupropion Passed - 10/24/2021  4:46 PM      Passed - Cr in normal range and within 360 days    Creatinine, Ser  Date Value Ref Range Status  07/11/2021 0.97 0.57 - 1.00 mg/dL Final         Passed - AST in normal range and within 360 days    AST  Date Value Ref Range Status  07/11/2021 14 0 - 40 IU/L Final         Passed - ALT in normal range and within 360 days    ALT  Date Value Ref Range Status  07/11/2021 12 0 - 32 IU/L Final         Passed - Completed PHQ-2 or PHQ-9 in the last 360 days      Passed - Last BP in normal range    BP Readings from Last 1 Encounters:  07/11/21 125/77         Passed - Valid encounter within last 6 months    Recent Outpatient Visits          1 week ago Chronic nasal congestion   [facility] Jon Billings, NP   3 months ago Annual physical exam   [facility] Jon Billings, NP   4 months ago Morning stiffness of joints   [facility] Jon Billings, NP   3 years ago Generalized anxiety disorder   [place] Delsa Grana, PA-C   4 years ago Generalized anxiety disorder   Secretary, Mary B, PA-C      Future Appointments            In 2 months Jon Billings, NP [facility] [place]

## 2021-10-24 NOTE — Telephone Encounter (Signed)
Lisa Craig with [facility] is calling in to request a refill for pt. She says that it was originally electronically sent but not showing in chart. Pt is requesting a refill for buPROPion (WELLBUTRIN XL) 300 MG 24 hr tablet    90 day supply up to 3 refills if possible.   Pharmacy:  [place], [place] Phone:  [phone]  Fax:  [phone]

## 2021-11-19 ENCOUNTER — Encounter: Payer: Self-pay | Admitting: Internal Medicine

## 2021-11-19 ENCOUNTER — Ambulatory Visit: Admitting: Internal Medicine

## 2021-11-19 VITALS — BP 124/64 | HR 117 | Temp 98.0°F | Resp 12 | Ht 70.0 in | Wt 174.4 lb

## 2021-11-19 DIAGNOSIS — J310 Chronic rhinitis: Secondary | ICD-10-CM

## 2021-11-19 DIAGNOSIS — T781XXA Other adverse food reactions, not elsewhere classified, initial encounter: Secondary | ICD-10-CM

## 2021-11-19 DIAGNOSIS — T7819XA Other adverse food reactions, not elsewhere classified, initial encounter: Secondary | ICD-10-CM

## 2021-11-19 MED ORDER — MOMETASONE FUROATE 50 MCG/ACT NA SUSP: 2.0000 | Freq: Every day | NASAL | 5 refills | Status: DC

## 2021-11-19 MED ORDER — AZELASTINE HCL 0.1 % NA SOLN: 2.0000 | Freq: Two times a day (BID) | NASAL | 5 refills | Status: DC

## 2021-11-19 NOTE — Patient Instructions (Addendum)
Rhinitis: - Use nasal saline rinses before nose sprays such as with Neilmed Sinus Rinse.  Use distilled water.   - Use Nasacort or Nasonex 2 sprays each nostril daily. Aim upward and outward. - Use Azelastine 1-2 sprays each nostril twice daily. Aim upward and outward. - Use Zyrtec 10 mg daily as needed for itchy water and runny nose.   - Stop Singulair.   Food Reaction - Avoid ginger.  Return in about 3 months (around 02/19/2022).

## 2021-11-19 NOTE — Progress Notes (Signed)
NEW PATIENT  Date of Service/Encounter:  11/19/21  Consult requested by: Jon Billings, NP   Subjective:   Lisa Craig (DOB: Jul 13, 1995) is a 26 y.o. female who presents to the clinic on 11/19/2021 with a chief complaint of Nasal Congestion (Chronic), Establish Care, and Allergy Testing (Retest. Had one when she was 26 yrs old. ) .    History obtained from: chart review and patient.   Rhinitis:  Started in childhood Symptoms include:  headaches/migraines, facial pain, nasal congestion, rhinorrhea, post nasal drainage, sneezing, and itchy nose . Occurs year-round Potential triggers: pollen Treatments tried: Zyrtec daily and Singulair at night; it worsens when she stops taking it.  Last use of anti-histamines was on 10/20. Unable to tolerate Flonase caused vomiting.  Saline rinses.   Previous allergy testing: yes around age 67; thinks it was positive to ragweed History of chronic sinusitis or sinus surgery: no Saw ENT in 2018 but told R frontal sinus was not developed and no sinusitis.  Concern for Food Allergy: Reports having trouble with Ginger which causes throat tickling and itching.  She usually drinks water and is fine. Never had to go to the ER or required Epipen.   She has a hx of childhood exercise asthma but never required inhalers. No trouble with SOB/wheezing/coughing.   Past Medical History: Past Medical History:  Diagnosis Date   Allergy    Anxiety    Depression    Frequent headaches     Past Surgical History: Past Surgical History:  Procedure Laterality Date   FOOT SURGERY Right    WISDOM TOOTH EXTRACTION      Family History: Family History  Problem Relation Age of Onset   Hypertension Mother    Allergic rhinitis Father    Cancer Paternal Aunt     Social History:  Lives in a 1970 year house Flooring in bedroom: carpet Pets: dog Tobacco use/exposure:second hand- husband smokes pipes rarely Job: HR specialist  Medication List:  Allergies as  of 11/19/2021       Reactions   Tramadol    Other reaction(s): Dry Mouth, Headache Dry mouth and heartburn   Codeine Nausea Only, Nausea And Vomiting        Medication List        Accurate as of November 19, 2021  4:24 PM. If you have any questions, ask your nurse or doctor.          azelastine 0.1 % nasal spray Commonly known as: ASTELIN Place 2 sprays into both nostrils 2 (two) times daily. Use in each nostril as directed Started by: Larose Kells, MD   buPROPion 300 MG 24 hr tablet Commonly known as: WELLBUTRIN XL Take 1 tablet (300 mg total) by mouth daily.   busPIRone 5 MG tablet Commonly known as: BUSPAR Take 1 tablet (5 mg total) by mouth 2 (two) times daily as needed.   cetirizine 10 MG tablet Commonly known as: ZYRTEC Take 10 mg by mouth daily.   cyclobenzaprine 10 MG tablet Commonly known as: FLEXERIL Take 10 mg by mouth 3 (three) times daily as needed for muscle spasms.   mometasone 50 MCG/ACT nasal spray Commonly known as: Nasonex 24HR Place 2 sprays into the nose daily. Started by: Larose Kells, MD   montelukast 10 MG tablet Commonly known as: SINGULAIR Take 1 tablet (10 mg total) by mouth at bedtime.   Simpesse 0.15-0.03 &0.01 MG tablet Generic drug: Levonorgestrel-Ethinyl Estradiol TAKE 1 TABLET BY MOUTH DAILY   Vitamin D (  Cholecalciferol) 25 MCG (1000 UT) Tabs Take 1,000 mcg by mouth daily.         REVIEW OF SYSTEMS: Pertinent positives and negatives discussed in HPI.   Objective:   Physical Exam: BP 124/64   Pulse (!) 117   Temp 98 F (36.7 C) (Temporal)   Resp 12   Ht 5\' 10"  (1.778 m)   Wt 174 lb 6.4 oz (79.1 kg)   SpO2 100%   BMI 25.02 kg/m  Body mass index is 25.02 kg/m. GEN: alert, well developed HEENT: clear conjunctiva, TM grey and translucent, nose with + inferior turbinate hypertrophy, pale nasal mucosa, slight clear rhinorrhea, + cobblestoning HEART: regular rate and rhythm, no murmur LUNGS: clear to  auscultation bilaterally, no coughing, unlabored respiration ABDOMEN: soft, non distended  SKIN: no rashes or lesions  Reviewed:  09/2021 PCP visit for nasal congestion, referred to Allergist.   Skin Testing:  Skin prick testing was placed, which includes aeroallergens/foods, histamine control, and saline control.  Verbal consent was obtained prior to placing test.  We discussed risks including anaphylaxis. Patient tolerated procedure well.  Allergy testing results were read and interpreted by myself, documented by clinical staff. Adequate positive and negative control.  Results discussed with patient/family.  Airborne Adult Perc - 11/19/21 1432     Time Antigen Placed 1437    Allergen Manufacturer Lavella Hammock    Location Back    Number of Test 59    1. Control-Buffer 50% Glycerol Negative    2. Control-Histamine 1 mg/ml 3+    3. Albumin saline Negative    4. Ceylon Negative    5. Guatemala Negative    6. Johnson Negative    7. [place] Blue Negative    8. Meadow Fescue Negative    9. Perennial Rye Negative    10. Sweet Vernal Negative    11. Timothy Negative    12. Cocklebur Negative    13. Burweed Marshelder Negative    14. Ragweed, short Negative    15. Ragweed, Giant Negative    16. Plantain,  English Negative    17. Lamb's Quarters Negative    18. Sheep Sorrell Negative    19. Rough Pigweed Negative    20. Marsh Elder, Rough Negative    21. Mugwort, Common Negative    22. Ash mix Negative    23. Birch mix Negative    24. Beech American Negative    25. Box, Elder Negative    26. Cedar, red Negative    27. Cottonwood, [place] Negative    28. Elm mix Negative    29. Hickory Negative    30. Maple mix Negative    31. Oak, [place] mix Negative    32. Pecan Pollen Negative    33. Pine mix Negative    34. Sycamore Eastern Negative    35. [place], Black Pollen Negative    36. Alternaria alternata Negative    37. Cladosporium Herbarum Negative    38. Aspergillus mix Negative     39. Penicillium mix Negative    40. Bipolaris sorokiniana (Helminthosporium) Negative    41. Drechslera spicifera (Curvularia) Negative    42. Mucor plumbeus Negative    43. Fusarium moniliforme Negative    44. Aureobasidium pullulans (pullulara) Negative    45. Rhizopus oryzae Negative    46. Botrytis cinera Negative    47. Epicoccum nigrum Negative    48. Phoma betae Negative    49. Candida Albicans Negative    50. Trichophyton mentagrophytes Negative  51. Mite, D Farinae  5,000 AU/ml Negative    52. Mite, D Pteronyssinus  5,000 AU/ml Negative    53. Cat Hair 10,000 BAU/ml Negative    54.  Dog Epithelia Negative    55. Mixed Feathers Negative    56. Horse Epithelia Negative    57. Cockroach, German Negative    58. Mouse Negative    59. Tobacco Leaf Negative             Intradermal - 11/19/21 1611     Time Antigen Placed 1455    Allergen Manufacturer Lavella Hammock    Location Arm    Number of Test 15    Control Negative    Guatemala Negative    Johnson Negative    7 Grass Negative    Ragweed mix Negative    Weed mix Negative    Tree mix Negative    Mold 1 Negative    Mold 2 Negative    Mold 3 Negative    Mold 4 Negative    Cat Negative    Dog Negative    Cockroach Negative    Mite mix Negative               Assessment:   1. Chronic rhinitis   2. Pollen-food allergy, initial encounter     Plan/Recommendations:  Chronic Rhinitis: - Use nasal saline rinses before nose sprays such as with Neilmed Sinus Rinse.  Use distilled water.   - Use Nasacort or Nasonex 2 sprays each nostril daily. Aim upward and outward. - Use Azelastine 1-2 sprays each nostril twice daily. Aim upward and outward. - Use Zyrtec 10 mg daily as needed for itchy water and runny nose.   - Stop Singulair. It is usually not helpful in chronic non allergic rhinitis.    Food Reaction - Could be PFAS or sulfites in ginger. - Avoid ginger. Does not sound like an IgE mediated reaction. No  need for Epipen.  Return in about 3 months (around 02/19/2022).       Return in about 3 months (around 02/19/2022).  Harlon Flor, MD [facility] [place]

## 2022-01-16 ENCOUNTER — Ambulatory Visit: Admitting: Nurse Practitioner

## 2022-02-21 ENCOUNTER — Ambulatory Visit: Admitting: Internal Medicine

## 2022-06-10 ENCOUNTER — Encounter: Payer: Self-pay | Admitting: Nurse Practitioner

## 2022-06-13 MED ORDER — "INSULIN SYRINGE 28G X 1/2"" 0.5 ML MISC": 1.0000 | 0 refills | Status: DC

## 2022-06-13 MED ORDER — CYANOCOBALAMIN 1000 MCG/ML IJ SOLN: 1000.0000 ug | INTRAMUSCULAR | 0 refills | Status: AC

## 2022-06-19 ENCOUNTER — Other Ambulatory Visit: Payer: Self-pay | Admitting: Nurse Practitioner

## 2022-06-19 NOTE — Telephone Encounter (Signed)
Requested medication (s) are due for refill today: expired medication  Requested medication (s) are on the active medication list: yes   Last refill:  03/13/20 # 91 3 refills  Future visit scheduled: no   Notes to clinic:  expired medication. Last ordered by Duane Lope, MD 03/13/20 requesting refill not to miss doses. Do you wan to renew Rx?     Requested Prescriptions  Pending Prescriptions Disp Refills   Levonorgestrel-Ethinyl Estradiol (SIMPESSE) 0.15-0.03 &0.01 MG tablet 91 tablet 3    Sig: Take 1 tablet by mouth daily.     OB/GYN:  Contraceptives Passed - 06/19/2022  3:42 PM      Passed - Last BP in normal range    BP Readings from Last 1 Encounters:  11/19/21 124/64         Passed - Valid encounter within last 12 months    Recent Outpatient Visits           8 months ago Chronic nasal congestion   [place]  [facility] Larae Grooms, NP   11 months ago Annual physical exam   [place] [facility] Larae Grooms, NP   1 year ago Morning stiffness of joints   [place] [facility] Larae Grooms, NP   4 years ago Generalized anxiety disorder   [facility] [facility] Danelle Berry, PA-C   4 years ago Generalized anxiety disorder   [place] [facility] Dorena Bodo, [place]              Passed - Patient is not a smoker

## 2022-06-19 NOTE — Telephone Encounter (Signed)
Medication Refill - Medication: SIMPESSE 0.15-0.03 &0.01 MG tablet  Doesn't have contact with prescribing Dr anymore  Has the patient contacted their pharmacy? yes (Agent: If no, request that the patient contact the pharmacy for the refill. If patient does not wish to contact the pharmacy document the reason why and proceed with request.) (Agent: If yes, when and what did the pharmacy advise?)contact pcp   Preferred Pharmacy (with phone number or street name):  [facility] [phone] - , [place] - [address] AT [facility] [address] Phone: [phone]  Fax: [phone]     Has the patient been seen for an appointment in the last year OR does the patient have an upcoming appointment? yes  Agent: Please be advised that RX refills may take up to 3 business days. We ask that you follow-up with your pharmacy.

## 2022-06-20 MED ORDER — LEVONORGEST-ETH ESTRAD 91-DAY 0.15-0.03 &0.01 MG PO TABS: 1.0000 | ORAL_TABLET | Freq: Every day | ORAL | 3 refills | Status: DC

## 2022-07-19 ENCOUNTER — Other Ambulatory Visit: Payer: Self-pay | Admitting: Nurse Practitioner

## 2022-07-21 NOTE — Telephone Encounter (Signed)
Requested medication (s) are due for refill today: Yes  Requested medication (s) are on the active medication list: Yes  Last refill:  10/24/21  Future visit scheduled: No  Notes to clinic:  Left message to call and make appointment.    Requested Prescriptions  Pending Prescriptions Disp Refills   buPROPion (WELLBUTRIN XL) 300 MG 24 hr tablet [Pharmacy Med Name: BUPROPION HCL XL TABS 300MG ] 90 tablet 3    Sig: TAKE 1 TABLET DAILY     Psychiatry: Antidepressants - bupropion Failed - 07/19/2022  8:23 AM      Failed - Cr in normal range and within 360 days    Creatinine, Ser  Date Value Ref Range Status  07/11/2021 0.97 0.57 - 1.00 mg/dL Final         Failed - AST in normal range and within 360 days    AST  Date Value Ref Range Status  07/11/2021 14 0 - 40 IU/L Final         Failed - ALT in normal range and within 360 days    ALT  Date Value Ref Range Status  07/11/2021 12 0 - 32 IU/L Final         Failed - Valid encounter within last 6 months    Recent Outpatient Visits           9 months ago Chronic nasal congestion   [place] [facility] Larae Grooms, NP   1 year ago Annual physical exam   [place] [facility] Larae Grooms, NP   1 year ago Morning stiffness of joints   [place] [facility] Larae Grooms, NP   4 years ago Generalized anxiety disorder   [facility] Danelle Berry, PA-C   4 years ago Generalized anxiety disorder   [facility] Dorena Bodo, PA-C              Passed - Completed PHQ-2 or PHQ-9 in the last 360 days      Passed - Last BP in normal range    BP Readings from Last 1 Encounters:  11/19/21 124/64

## 2022-07-21 NOTE — Telephone Encounter (Signed)
Scheduled for tomorrow 07/22/2022 at 4:00 pm.

## 2022-07-21 NOTE — Telephone Encounter (Signed)
Patient is overdue for an appointment. Please call to schedule and then route to the provider for refill.  

## 2022-07-22 ENCOUNTER — Telehealth: Admitting: Nurse Practitioner

## 2022-07-22 ENCOUNTER — Encounter: Payer: Self-pay | Admitting: Nurse Practitioner

## 2022-07-22 DIAGNOSIS — F32A Depression, unspecified: Secondary | ICD-10-CM | POA: Diagnosis not present

## 2022-07-22 DIAGNOSIS — F419 Anxiety disorder, unspecified: Secondary | ICD-10-CM

## 2022-07-22 DIAGNOSIS — E538 Deficiency of other specified B group vitamins: Secondary | ICD-10-CM | POA: Insufficient documentation

## 2022-07-22 NOTE — Assessment & Plan Note (Signed)
Chronic.  Controlled.  Continue with current medication regimen of Wellbutrin and PRN Buspar.  Labs ordered today.  Return to clinic in 6 months for reevaluation.  Call sooner if concerns arise.   

## 2022-07-22 NOTE — Assessment & Plan Note (Signed)
Chronic.  Has received 5 weekly injections.  Will continue with weekly injections for 12 weeks then go to bi weekly for two months followed by monthly.  She will have her levels rechecked with Rheumatology in 6 months.

## 2022-07-22 NOTE — Progress Notes (Signed)
There were no vitals taken for this visit.   Subjective:    Patient ID: Lisa Craig, female    DOB: 11-05-1995, 27 y.o.   MRN: [id number]  HPI: Lisa Craig is a 27 y.o. female  Chief Complaint  Patient presents with   Medication Refill    Patient is requesting refill of Wellbutrin   DEPRESSION Patient states her depression is controlled.  She feels like the wellbutrin is working great for her.  Denies concerns regarding She feels like her anxiety is not as controlled.  She is wondering if she can take something for situational control of her anxiety.  Denies SI.   Flowsheet Row Video Visit from 07/22/2022 in [facility]  PHQ-9 Total Score 11         07/22/2022    3:59 PM 10/16/2021    3:05 PM 07/11/2021    9:38 AM 06/07/2021    8:47 AM  GAD 7 : Generalized Anxiety Score  Nervous, Anxious, on Edge 2 3 3 3   Control/stop worrying 1 2 3 2   Worry too much - different things 1 2 2 2   Trouble relaxing 1 1 2 2   Restless 0 1 1 1   Easily annoyed or irritable 1 0 2 1  Afraid - awful might happen 0 1 2 2   Total GAD 7 Score 6 10 15 13   Anxiety Difficulty Somewhat difficult Somewhat difficult Very difficult Somewhat difficult      Relevant past medical, surgical, family and social history reviewed and updated as indicated. Interim medical history since our last visit reviewed. Allergies and medications reviewed and updated.  Review of Systems  Psychiatric/Behavioral:  Positive for dysphoric mood. Negative for sleep disturbance. The patient is nervous/anxious.     Per HPI unless specifically indicated above     Objective:    There were no vitals taken for this visit.  Wt Readings from Last 3 Encounters:  11/19/21 174 lb 6.4 oz (79.1 kg)  07/11/21 168 lb 6.4 oz (76.4 kg)  06/07/21 165 lb 12.8 oz (75.2 kg)    Physical Exam Vitals and nursing note reviewed.  HENT:     Head: Normocephalic.     Right Ear: Hearing normal.     Left Ear: Hearing  normal.     Nose: Nose normal.  Eyes:     Pupils: Pupils are equal, round, and reactive to light.  Pulmonary:     Effort: Pulmonary effort is normal. No respiratory distress.  Neurological:     Mental Status: She is alert.  Psychiatric:        Mood and Affect: Mood normal.        Behavior: Behavior normal.        Thought Content: Thought content normal.        Judgment: Judgment normal.     Results for orders placed or performed in visit on 07/11/21  CBC with Differential/Platelet  Result Value Ref Range   WBC 5.6 3.4 - 10.8 x10E3/uL   RBC 4.50 3.77 - 5.28 x10E6/uL   Hemoglobin 13.4 11.1 - 15.9 g/dL   Hematocrit 78.2 95.6 - 46.6 %   MCV 90 79 - 97 fL   MCH 29.8 26.6 - 33.0 pg   MCHC 33.3 31.5 - 35.7 g/dL   RDW 21.3 08.6 - 57.8 %   Platelets 316 150 - 450 x10E3/uL   Neutrophils 45 Not Estab. %   Lymphs 46 Not Estab. %   Monocytes 6 Not Estab. %  Eos 2 Not Estab. %   Basos 1 Not Estab. %   Neutrophils Absolute 2.6 1.4 - 7.0 x10E3/uL   Lymphocytes Absolute 2.5 0.7 - 3.1 x10E3/uL   Monocytes Absolute 0.4 0.1 - 0.9 x10E3/uL   EOS (ABSOLUTE) 0.1 0.0 - 0.4 x10E3/uL   Basophils Absolute 0.0 0.0 - 0.2 x10E3/uL   Immature Granulocytes 0 Not Estab. %   Immature Grans (Abs) 0.0 0.0 - 0.1 x10E3/uL  Comprehensive metabolic panel  Result Value Ref Range   Glucose 83 70 - 99 mg/dL   BUN 11 6 - 20 mg/dL   Creatinine, Ser 1.61 0.57 - 1.00 mg/dL   eGFR 82 >09 UE/AVW/0.98   BUN/Creatinine Ratio 11 9 - 23   Sodium 138 134 - 144 mmol/L   Potassium 4.3 3.5 - 5.2 mmol/L   Chloride 104 96 - 106 mmol/L   CO2 24 20 - 29 mmol/L   Calcium 9.5 8.7 - 10.2 mg/dL   Total Protein 6.8 6.0 - 8.5 g/dL   Albumin 4.3 3.9 - 5.0 g/dL   Globulin, Total 2.5 1.5 - 4.5 g/dL   Albumin/Globulin Ratio 1.7 1.2 - 2.2   Bilirubin Total 0.2 0.0 - 1.2 mg/dL   Alkaline Phosphatase 83 44 - 121 IU/L   AST 14 0 - 40 IU/L   ALT 12 0 - 32 IU/L  Lipid panel  Result Value Ref Range   Cholesterol, Total 185 100 -  199 mg/dL   Triglycerides 94 0 - 149 mg/dL   HDL 47 >11 mg/dL   VLDL Cholesterol Cal 17 5 - 40 mg/dL   LDL Chol Calc (NIH) 914 (H) 0 - 99 mg/dL   Chol/HDL Ratio 3.9 0.0 - 4.4 ratio  TSH  Result Value Ref Range   TSH 2.340 0.450 - 4.500 uIU/mL  Urinalysis, Routine w reflex microscopic  Result Value Ref Range   Specific Gravity, UA 1.015 1.005 - 1.030   pH, UA 8.5 (H) 5.0 - 7.5   Color, UA Yellow Yellow   Appearance Ur Cloudy (A) Clear   Leukocytes,UA Negative Negative   Protein,UA Trace (A) Negative/Trace   Glucose, UA Negative Negative   Ketones, UA Negative Negative   RBC, UA Negative Negative   Bilirubin, UA Negative Negative   Urobilinogen, Ur 0.2 0.2 - 1.0 mg/dL   Nitrite, UA Negative Negative  HIV Antibody (routine testing w rflx)  Result Value Ref Range   HIV Screen 4th Generation wRfx Non Reactive Non Reactive  Hepatitis C Antibody  Result Value Ref Range   Hep C Virus Ab Non Reactive Non Reactive      Assessment & Plan:   Problem List Items Addressed This Visit       Other   Anxiety    Chronic.  Controlled.  Continue with current medication regimen of Wellbutrin and PRN Buspar.  Labs ordered today.  Return to clinic in 6 months for reevaluation.  Call sooner if concerns arise.       Depression - Primary    Chronic.  Controlled.  Continue with current medication regimen of Wellbutrin and PRN Buspar.  Labs ordered today.  Return to clinic in 6 months for reevaluation.  Call sooner if concerns arise.        B12 deficiency    Chronic.  Has received 5 weekly injections.  Will continue with weekly injections for 12 weeks then go to bi weekly for two months followed by monthly.  She will have her levels rechecked with Rheumatology in 6  months.        Follow up plan: Return in about 6 months (around 01/21/2023) for Physical and Fasting labs.   This visit was completed via MyChart due to the restrictions of the COVID-19 pandemic. All issues as above were  discussed and addressed. Physical exam was done as above through visual confirmation on MyChart. If it was felt that the patient should be evaluated in the office, they were directed there. The patient verbally consented to this visit. Location of the patient: Home Location of the provider: Office Those involved with this call:  Provider: Larae Grooms, NP CMA: Lauris Chroman, CMA Front Desk/Registration: Servando Snare This encounter was conducted via video.  I spent 20 dedicated to the care of this patient on the date of this encounter to include previsit review of symptoms, plan of care and follow up, face to face time with the patient, and post visit ordering of testing.

## 2022-07-22 NOTE — Assessment & Plan Note (Signed)
Chronic.  Controlled.  Continue with current medication regimen of Wellbutrin and PRN Buspar.  Labs ordered today.  Return to clinic in 6 months for reevaluation.  Call sooner if concerns arise.

## 2022-09-30 ENCOUNTER — Other Ambulatory Visit: Payer: Self-pay | Admitting: Medical Genetics

## 2022-09-30 DIAGNOSIS — Z006 Encounter for examination for normal comparison and control in clinical research program: Secondary | ICD-10-CM

## 2023-01-15 ENCOUNTER — Other Ambulatory Visit (HOSPITAL_COMMUNITY)
Admission: RE | Admit: 2023-01-15 | Discharge: 2023-01-15 | Disposition: A | Discharge status: Home / Self Care | Source: Ambulatory Visit | Attending: Nurse Practitioner | Admitting: Nurse Practitioner

## 2023-01-15 ENCOUNTER — Encounter: Payer: Self-pay | Admitting: Nurse Practitioner

## 2023-01-15 ENCOUNTER — Ambulatory Visit: Admitting: Nurse Practitioner

## 2023-01-15 VITALS — BP 113/77 | HR 88 | Temp 98.4°F | Ht 70.0 in | Wt 181.8 lb

## 2023-01-15 DIAGNOSIS — Z0000 Encounter for general adult medical examination without abnormal findings: Secondary | ICD-10-CM | POA: Insufficient documentation

## 2023-01-15 DIAGNOSIS — Z136 Encounter for screening for cardiovascular disorders: Secondary | ICD-10-CM | POA: Diagnosis not present

## 2023-01-15 DIAGNOSIS — F419 Anxiety disorder, unspecified: Secondary | ICD-10-CM

## 2023-01-15 DIAGNOSIS — E559 Vitamin D deficiency, unspecified: Secondary | ICD-10-CM | POA: Diagnosis not present

## 2023-01-15 DIAGNOSIS — F32A Depression, unspecified: Secondary | ICD-10-CM

## 2023-01-15 DIAGNOSIS — E538 Deficiency of other specified B group vitamins: Secondary | ICD-10-CM | POA: Diagnosis not present

## 2023-01-15 LAB — URINALYSIS, ROUTINE W REFLEX MICROSCOPIC
Bilirubin, UA: NEGATIVE
Glucose, UA: NEGATIVE
Ketones, UA: NEGATIVE
Leukocytes,UA: NEGATIVE
Nitrite, UA: NEGATIVE
Protein,UA: NEGATIVE
Specific Gravity, UA: 1.02 (ref 1.005–1.030)
Urobilinogen, Ur: 0.2 mg/dL (ref 0.2–1.0)
pH, UA: 6 (ref 5.0–7.5)

## 2023-01-15 LAB — MICROSCOPIC EXAMINATION
Bacteria, UA: NONE SEEN
WBC, UA: NONE SEEN /[HPF] (ref 0–5)

## 2023-01-15 MED ORDER — BUPROPION HCL ER (XL) 300 MG PO TB24: 300.0000 mg | ORAL_TABLET | Freq: Every day | ORAL | 1 refills | Status: DC

## 2023-01-15 MED ORDER — LEVONORGEST-ETH ESTRAD 91-DAY 0.15-0.03 &0.01 MG PO TABS: 1.0000 | ORAL_TABLET | Freq: Every day | ORAL | 3 refills | Status: DC

## 2023-01-15 MED ORDER — BUSPIRONE HCL 5 MG PO TABS: 5.0000 mg | ORAL_TABLET | Freq: Two times a day (BID) | ORAL | 1 refills | Status: DC | PRN

## 2023-01-15 NOTE — Assessment & Plan Note (Signed)
Chronic.  Controlled.  Continue with current medication regimen of Wellbutrin 300mg daily.  Refills sent today. Labs ordered today.  Return to clinic in 6 months for reevaluation.  Call sooner if concerns arise.  

## 2023-01-15 NOTE — Assessment & Plan Note (Signed)
Labs ordered at visit today.  Will make recommendations based on lab results.   

## 2023-01-15 NOTE — Progress Notes (Signed)
BP 113/77 (BP Location: Left Arm, Patient Position: Sitting, Cuff Size: Normal)   Pulse 88   Temp 98.4 F (36.9 C) (Oral)   Ht 5\' 10"  (1.778 m)   Wt 181 lb 12.8 oz (82.5 kg)   SpO2 98%   BMI 26.09 kg/m    Subjective:    Patient ID: Lisa Craig, female    DOB: 1995/11/28, 28 y.o.   MRN: [id number]  HPI: Lisa Craig is a 27 y.o. female presenting on 01/15/2023 for comprehensive medical examination. Current medical complaints include: Refill of Wellbutrin   She currently lives with: Menopausal Symptoms: no  DEPRESSION Patient states her depression is mostly better.  She started a new job in may.   She feels like her anxiety is doing okay.  She ran out of the Buspar and needs a refill.   Feels like the Wellbutrin 300mg  is working well for her.  Denies SI.   Depression Screen done today and results listed below:     01/15/2023    9:22 AM 07/22/2022    3:57 PM 10/16/2021    3:04 PM 07/11/2021    9:37 AM 06/07/2021    8:47 AM  Depression screen PHQ 2/9  Decreased Interest 2 1 1 2 2   Down, Depressed, Hopeless 1 1 2 2 1   PHQ - 2 Score 3 2 3 4 3   Altered sleeping 2 0 2 2 3   Tired, decreased energy 3 2 3 3 2   Change in appetite 1 2 1 1  0  Feeling bad or failure about yourself  0 2 0 1 1  Trouble concentrating 2 3 2 2 1   Moving slowly or fidgety/restless 0 0 0 0 0  Suicidal thoughts 0 0 0 0 0  PHQ-9 Score 11 11 11 13 10   Difficult doing work/chores  Somewhat difficult Somewhat difficult Somewhat difficult Somewhat difficult    The patient does not have a history of falls. I did complete a risk assessment for falls. A plan of care for falls was documented.   Past Medical History:  Past Medical History:  Diagnosis Date   Allergy    Anxiety    Depression    Frequent headaches     Surgical History:  Past Surgical History:  Procedure Laterality Date   FOOT SURGERY Right    WISDOM TOOTH EXTRACTION      Medications:  Current Outpatient Medications on File Prior to Visit   Medication Sig   cyanocobalamin (VITAMIN B12) 1000 MCG/ML injection Inject 1 mL (1,000 mcg total) into the muscle once a week. Once weekly for 12 weeks. (Patient taking differently: Inject 1,000 mcg into the muscle every 30 (thirty) days.)   Vitamin D, Cholecalciferol, 25 MCG (1000 UT) TABS Take 1,000 mcg by mouth daily.   No current facility-administered medications on file prior to visit.    Allergies:  Allergies  Allergen Reactions   Tramadol     Other reaction(s): Dry Mouth, Headache Dry mouth and heartburn    Codeine Nausea Only and Nausea And Vomiting    Social History:  Social History   Socioeconomic History   Marital status: Married    Spouse name: Not on file   Number of children: Not on file   Years of education: Not on file   Highest education level: Not on file  Occupational History   Not on file  Tobacco Use   Smoking status: Never    Passive exposure: Current   Smokeless tobacco: Never  Vaping Use  Vaping status: Never Used  Substance and Sexual Activity   Alcohol use: Yes    Comment: occ. wine   Drug use: No   Sexual activity: Yes    Birth control/protection: Pill  Other Topics Concern   Not on file  Social History Narrative   Not on file   Social Drivers of Health   Financial Resource Strain: Medium Risk (07/25/2020)   Received from [facility], [facility]   Overall Financial Resource Strain (CARDIA)    Difficulty of Paying Living Expenses: Somewhat hard  Food Insecurity: No Food Insecurity (07/25/2020)   [facility] from [facility], [facility]   Hunger Vital Sign    Worried About Running Out of Food in the Last Year: Never true    Ran Out of Food in the Last Year: Never true  Transportation Needs: No Transportation Needs (07/25/2020)   Received from [facility], [facility]   [facility] - Transportation    Lack of Transportation (Medical): No    Lack of Transportation (Non-Medical): No  Physical Activity: Not on file   Stress: Not on file  Social Connections: Not on file  Intimate Partner Violence: Not on file   Social History   Tobacco Use  Smoking Status Never   Passive exposure: Current  Smokeless Tobacco Never   Social History   Substance and Sexual Activity  Alcohol Use Yes   Comment: occ. wine    Family History:  Family History  Problem Relation Age of Onset   Hypertension Mother    Allergic rhinitis Father    Cancer Paternal Aunt     Past medical history, surgical history, medications, allergies, family history and social history reviewed with patient today and changes made to appropriate areas of the chart.   Review of Systems  Eyes:  Negative for blurred vision and double vision.  Respiratory:  Negative for shortness of breath.   Cardiovascular:  Negative for chest pain, palpitations and leg swelling.  Neurological:  Negative for dizziness and headaches.  Psychiatric/Behavioral:  Negative for depression. The patient is nervous/anxious.    All other ROS negative except what is listed above and in the HPI.      Objective:    BP 113/77 (BP Location: Left Arm, Patient Position: Sitting, Cuff Size: Normal)   Pulse 88   Temp 98.4 F (36.9 C) (Oral)   Ht 5\' 10"  (1.778 m)   Wt 181 lb 12.8 oz (82.5 kg)   SpO2 98%   BMI 26.09 kg/m   Wt Readings from Last 3 Encounters:  01/15/23 181 lb 12.8 oz (82.5 kg)  11/19/21 174 lb 6.4 oz (79.1 kg)  07/11/21 168 lb 6.4 oz (76.4 kg)    Physical Exam Vitals and nursing note reviewed. Exam conducted with a chaperone present Oswaldo Conroy, CMA).  Constitutional:      General: She is awake. She is not in acute distress.    Appearance: Normal appearance. She is well-developed and normal weight. She is not ill-appearing.  HENT:     Head: Normocephalic and atraumatic.     Right Ear: Hearing, tympanic membrane, ear canal and external ear normal. No drainage.     Left Ear: Hearing, tympanic membrane, ear canal and external ear normal. No  drainage.     Nose: Nose normal.     Right Sinus: No maxillary sinus tenderness or frontal sinus tenderness.     Left Sinus: No maxillary sinus tenderness or frontal sinus tenderness.  Mouth/Throat:     Mouth: Mucous membranes are moist.     Pharynx: Oropharynx is clear. Uvula midline. No pharyngeal swelling, oropharyngeal exudate or posterior oropharyngeal erythema.  Eyes:     General: Lids are normal.        Right eye: No discharge.        Left eye: No discharge.     Extraocular Movements: Extraocular movements intact.     Conjunctiva/sclera: Conjunctivae normal.     Pupils: Pupils are equal, round, and reactive to light.     Visual Fields: Right eye visual fields normal and left eye visual fields normal.  Neck:     Thyroid: No thyromegaly.     Vascular: No carotid bruit.     Trachea: Trachea normal.  Cardiovascular:     Rate and Rhythm: Normal rate and regular rhythm.     Heart sounds: Normal heart sounds. No murmur heard.    No gallop.  Pulmonary:     Effort: Pulmonary effort is normal. No accessory muscle usage or respiratory distress.     Breath sounds: Normal breath sounds.  Chest:  Breasts:    Right: Normal.     Left: Normal.  Abdominal:     General: Bowel sounds are normal.     Palpations: Abdomen is soft. There is no hepatomegaly or splenomegaly.     Tenderness: There is no abdominal tenderness.  Genitourinary:    Exam position: Knee-chest position.     Cervix: Normal.     Adnexa: Right adnexa normal and left adnexa normal.  Musculoskeletal:        General: Normal range of motion.     Cervical back: Normal range of motion and neck supple.     Right lower leg: No edema.     Left lower leg: No edema.  Lymphadenopathy:     Head:     Right side of head: No submental, submandibular, tonsillar, preauricular or posterior auricular adenopathy.     Left side of head: No submental, submandibular, tonsillar, preauricular or posterior auricular adenopathy.      Cervical: No cervical adenopathy.     Upper Body:     Right upper body: No supraclavicular, axillary or pectoral adenopathy.     Left upper body: No supraclavicular, axillary or pectoral adenopathy.  Skin:    General: Skin is warm and dry.     Capillary Refill: Capillary refill takes less than 2 seconds.     Findings: No rash.  Neurological:     Mental Status: She is alert and oriented to person, place, and time.     Gait: Gait is intact.     Deep Tendon Reflexes: Reflexes are normal and symmetric.     Reflex Scores:      Brachioradialis reflexes are 2+ on the right side and 2+ on the left side.      Patellar reflexes are 2+ on the right side and 2+ on the left side. Psychiatric:        Attention and Perception: Attention normal.        Mood and Affect: Mood normal.        Speech: Speech normal.        Behavior: Behavior normal. Behavior is cooperative.        Thought Content: Thought content normal.        Judgment: Judgment normal.     Results for orders placed or performed in visit on 07/11/21  Urinalysis, Routine w reflex microscopic   Collection Time:  07/11/21  9:51 AM  Result Value Ref Range   Specific Gravity, UA 1.015 1.005 - 1.030   pH, UA 8.5 (H) 5.0 - 7.5   Color, UA Yellow Yellow   Appearance Ur Cloudy (A) Clear   Leukocytes,UA Negative Negative   Protein,UA Trace (A) Negative/Trace   Glucose, UA Negative Negative   Ketones, UA Negative Negative   RBC, UA Negative Negative   Bilirubin, UA Negative Negative   Urobilinogen, Ur 0.2 0.2 - 1.0 mg/dL   Nitrite, UA Negative Negative  CBC with Differential/Platelet   Collection Time: 07/11/21  9:55 AM  Result Value Ref Range   WBC 5.6 3.4 - 10.8 x10E3/uL   RBC 4.50 3.77 - 5.28 x10E6/uL   Hemoglobin 13.4 11.1 - 15.9 g/dL   Hematocrit 95.2 84.1 - 46.6 %   MCV 90 79 - 97 fL   MCH 29.8 26.6 - 33.0 pg   MCHC 33.3 31.5 - 35.7 g/dL   RDW 32.4 40.1 - 02.7 %   Platelets 316 150 - 450 x10E3/uL   Neutrophils 45 Not  Estab. %   Lymphs 46 Not Estab. %   Monocytes 6 Not Estab. %   Eos 2 Not Estab. %   Basos 1 Not Estab. %   Neutrophils Absolute 2.6 1.4 - 7.0 x10E3/uL   Lymphocytes Absolute 2.5 0.7 - 3.1 x10E3/uL   Monocytes Absolute 0.4 0.1 - 0.9 x10E3/uL   EOS (ABSOLUTE) 0.1 0.0 - 0.4 x10E3/uL   Basophils Absolute 0.0 0.0 - 0.2 x10E3/uL   Immature Granulocytes 0 Not Estab. %   Immature Grans (Abs) 0.0 0.0 - 0.1 x10E3/uL  Comprehensive metabolic panel   Collection Time: 07/11/21  9:55 AM  Result Value Ref Range   Glucose 83 70 - 99 mg/dL   BUN 11 6 - 20 mg/dL   Creatinine, Ser 2.53 0.57 - 1.00 mg/dL   eGFR 82 >66 YQ/IHK/7.42   BUN/Creatinine Ratio 11 9 - 23   Sodium 138 134 - 144 mmol/L   Potassium 4.3 3.5 - 5.2 mmol/L   Chloride 104 96 - 106 mmol/L   CO2 24 20 - 29 mmol/L   Calcium 9.5 8.7 - 10.2 mg/dL   Total Protein 6.8 6.0 - 8.5 g/dL   Albumin 4.3 3.9 - 5.0 g/dL   Globulin, Total 2.5 1.5 - 4.5 g/dL   Albumin/Globulin Ratio 1.7 1.2 - 2.2   Bilirubin Total 0.2 0.0 - 1.2 mg/dL   Alkaline Phosphatase 83 44 - 121 IU/L   AST 14 0 - 40 IU/L   ALT 12 0 - 32 IU/L  Lipid panel   Collection Time: 07/11/21  9:55 AM  Result Value Ref Range   Cholesterol, Total 185 100 - 199 mg/dL   Triglycerides 94 0 - 149 mg/dL   HDL 47 >59 mg/dL   VLDL Cholesterol Cal 17 5 - 40 mg/dL   LDL Chol Calc (NIH) 563 (H) 0 - 99 mg/dL   Chol/HDL Ratio 3.9 0.0 - 4.4 ratio  TSH   Collection Time: 07/11/21  9:55 AM  Result Value Ref Range   TSH 2.340 0.450 - 4.500 uIU/mL  HIV Antibody (routine testing w rflx)   Collection Time: 07/11/21  9:55 AM  Result Value Ref Range   HIV Screen 4th Generation wRfx Non Reactive Non Reactive  Hepatitis C Antibody   Collection Time: 07/11/21  9:55 AM  Result Value Ref Range   Hep C Virus Ab Non Reactive Non Reactive      Assessment &  Plan:   Problem List Items Addressed This Visit       Other   Anxiety   Chronic.  Controlled.  Continue with current medication regimen of  Wellbutrin 300mg  and PRN Buspar.  Refills sent today.  Labs ordered today.  Return to clinic in 6 months for reevaluation.  Call sooner if concerns arise.        Relevant Medications   buPROPion (WELLBUTRIN XL) 300 MG 24 hr tablet   busPIRone (BUSPAR) 5 MG tablet   Depression   Chronic.  Controlled.  Continue with current medication regimen of Wellbutrin 300mg  daily.  Refills sent today.  Labs ordered today.  Return to clinic in 6 months for reevaluation.  Call sooner if concerns arise.        Relevant Medications   buPROPion (WELLBUTRIN XL) 300 MG 24 hr tablet   busPIRone (BUSPAR) 5 MG tablet   B12 deficiency   Labs ordered at visit today.  Will make recommendations based on lab results.        Relevant Orders   B12   Other Visit Diagnoses       Annual physical exam    -  Primary   Health maintenance reviewed during visit today.  Labs ordered.  Vaccines reviewed.  PAP done today.   Relevant Orders   CBC with Differential/Platelet   Comprehensive metabolic panel   Lipid panel   TSH   Urinalysis, Routine w reflex microscopic   Cytology - PAP     Screening for ischemic heart disease       Relevant Orders   Lipid panel     Vitamin D deficiency       Relevant Orders   Vitamin D (25 hydroxy)         Follow up plan: Return in about 6 months (around 07/16/2023) for Depression/Anxiety FU.   LABORATORY TESTING:  - Pap smear: Done today   IMMUNIZATIONS:   - Tdap: Tetanus vaccination status reviewed: last tetanus booster within 10 years. - Influenza: Postponed to flu season - Pneumovax: Not applicable - Prevnar: Not applicable - COVID: Not applicable - HPV: Up to date - Shingrix vaccine: Not applicable  SCREENING: -Mammogram: Not applicable  - Colonoscopy: Not applicable  - Bone Density: Not applicable  -Hearing Test: Not applicable  -Spirometry: Not applicable   PATIENT COUNSELING:   Advised to take 1 mg of folate supplement per day if capable of pregnancy.    Sexuality: Discussed sexually transmitted diseases, partner selection, use of condoms, avoidance of unintended pregnancy  and contraceptive alternatives.   Advised to avoid cigarette smoking.  I discussed with the patient that most people either abstain from alcohol or drink within safe limits (<=14/week and <=4 drinks/occasion for males, <=7/weeks and <= 3 drinks/occasion for females) and that the risk for alcohol disorders and other health effects rises proportionally with the number of drinks per week and how often a drinker exceeds daily limits.  Discussed cessation/primary prevention of drug use and availability of treatment for abuse.   Diet: Encouraged to adjust caloric intake to maintain  or achieve ideal body weight, to reduce intake of dietary saturated fat and total fat, to limit sodium intake by avoiding high sodium foods and not adding table salt, and to maintain adequate dietary potassium and calcium preferably from fresh fruits, vegetables, and low-fat dairy products.    stressed the importance of regular exercise  Injury prevention: Discussed safety belts, safety helmets, smoke detector, smoking near bedding or upholstery.  Dental health: Discussed importance of regular tooth brushing, flossing, and dental visits.    NEXT PREVENTATIVE PHYSICAL DUE IN 1 YEAR. Return in about 6 months (around 07/16/2023) for Depression/Anxiety FU.

## 2023-01-15 NOTE — Assessment & Plan Note (Signed)
Chronic.  Controlled.  Continue with current medication regimen of Wellbutrin 300mg  and PRN Buspar.  Refills sent today.  Labs ordered today.  Return to clinic in 6 months for reevaluation.  Call sooner if concerns arise.

## 2023-01-16 LAB — CBC WITH DIFFERENTIAL/PLATELET
Basophils Absolute: 0 10*3/uL (ref 0.0–0.2)
Basos: 1 %
EOS (ABSOLUTE): 0.1 10*3/uL (ref 0.0–0.4)
Eos: 1 %
Hematocrit: 39.3 % (ref 34.0–46.6)
Hemoglobin: 12.7 g/dL (ref 11.1–15.9)
Immature Grans (Abs): 0 10*3/uL (ref 0.0–0.1)
Immature Granulocytes: 0 %
Lymphocytes Absolute: 2.5 10*3/uL (ref 0.7–3.1)
Lymphs: 44 %
MCH: 29.3 pg (ref 26.6–33.0)
MCHC: 32.3 g/dL (ref 31.5–35.7)
MCV: 91 fL (ref 79–97)
Monocytes Absolute: 0.4 10*3/uL (ref 0.1–0.9)
Monocytes: 7 %
Neutrophils Absolute: 2.7 10*3/uL (ref 1.4–7.0)
Neutrophils: 47 %
Platelets: 322 10*3/uL (ref 150–450)
RBC: 4.34 x10E6/uL (ref 3.77–5.28)
RDW: 11.4 % — ABNORMAL LOW (ref 11.7–15.4)
WBC: 5.7 10*3/uL (ref 3.4–10.8)

## 2023-01-16 LAB — VITAMIN B12: Vitamin B-12: 389 pg/mL (ref 232–1245)

## 2023-01-16 LAB — COMPREHENSIVE METABOLIC PANEL
ALT: 13 [IU]/L (ref 0–32)
AST: 13 [IU]/L (ref 0–40)
Albumin: 4.3 g/dL (ref 4.0–5.0)
Alkaline Phosphatase: 88 [IU]/L (ref 44–121)
BUN/Creatinine Ratio: 14 (ref 9–23)
BUN: 14 mg/dL (ref 6–20)
Bilirubin Total: <0.2 mg/dL (ref 0.0–1.2)
CO2: 20 mmol/L (ref 20–29)
Calcium: 9.2 mg/dL (ref 8.7–10.2)
Chloride: 104 mmol/L (ref 96–106)
Creatinine, Ser: 0.99 mg/dL (ref 0.57–1.00)
Globulin, Total: 2.3 g/dL (ref 1.5–4.5)
Glucose: 68 mg/dL — ABNORMAL LOW (ref 70–99)
Potassium: 3.9 mmol/L (ref 3.5–5.2)
Sodium: 139 mmol/L (ref 134–144)
Total Protein: 6.6 g/dL (ref 6.0–8.5)
eGFR: 80 mL/min/{1.73_m2} (ref >59)

## 2023-01-16 LAB — VITAMIN D 25 HYDROXY (VIT D DEFICIENCY, FRACTURES): Vit D, 25-Hydroxy: 43.8 ng/mL (ref 30.0–100.0)

## 2023-01-16 LAB — LIPID PANEL
Chol/HDL Ratio: 3.5 {ratio} (ref 0.0–4.4)
Cholesterol, Total: 188 mg/dL (ref 100–199)
HDL: 53 mg/dL (ref >39)
LDL Chol Calc (NIH): 124 mg/dL — ABNORMAL HIGH (ref 0–99)
Triglycerides: 61 mg/dL (ref 0–149)
VLDL Cholesterol Cal: 11 mg/dL (ref 5–40)

## 2023-01-16 LAB — CYTOLOGY - PAP
Adequacy: ABSENT
Diagnosis: NEGATIVE

## 2023-01-16 LAB — TSH: TSH: 2.33 u[IU]/mL (ref 0.450–4.500)

## 2023-07-16 ENCOUNTER — Encounter: Payer: Self-pay | Admitting: Nurse Practitioner

## 2023-07-16 ENCOUNTER — Ambulatory Visit: Payer: Self-pay | Admitting: Nurse Practitioner

## 2023-07-16 VITALS — BP 122/83 | HR 95 | Ht 70.0 in | Wt 181.0 lb

## 2023-07-16 DIAGNOSIS — F411 Generalized anxiety disorder: Secondary | ICD-10-CM | POA: Diagnosis not present

## 2023-07-16 DIAGNOSIS — E559 Vitamin D deficiency, unspecified: Secondary | ICD-10-CM | POA: Diagnosis not present

## 2023-07-16 DIAGNOSIS — E538 Deficiency of other specified B group vitamins: Secondary | ICD-10-CM

## 2023-07-16 DIAGNOSIS — F32A Depression, unspecified: Secondary | ICD-10-CM

## 2023-07-16 MED ORDER — BUSPIRONE HCL 5 MG PO TABS: 5.0000 mg | ORAL_TABLET | Freq: Two times a day (BID) | ORAL | 1 refills | Status: AC | PRN

## 2023-07-16 MED ORDER — BUPROPION HCL ER (XL) 300 MG PO TB24: 300.0000 mg | ORAL_TABLET | Freq: Every day | ORAL | 1 refills | Status: AC

## 2023-07-16 NOTE — Progress Notes (Signed)
 BP 122/83   Pulse 95   Ht 5' 10 (1.778 m)   Wt 181 lb (82.1 kg)   LMP 05/28/2023   BMI 25.97 kg/m    Subjective:    Patient ID: Lisa Craig, female    DOB: 1995/03/12, 28 y.o.   MRN: [id number]  HPI: Lisa Craig is a 28 y.o. female  Chief Complaint  Patient presents with   Medical Management of Chronic Issues   DEPRESSION Patient states her depression is okay.  Her job is stressful. She is able to work from home which does help.  She feels like her anxiety is doing okay.  She only takes the Buspar  PRN.  Feels like it help when she gets really upset about something. Feels like the Wellbutrin  300mg  is working well for her.  Denies SI.      07/16/2023    9:57 AM 01/15/2023    9:22 AM 07/22/2022    3:57 PM  PHQ9 SCORE ONLY  PHQ-9 Total Score 10 11 11       07/16/2023    9:58 AM 01/15/2023    9:22 AM 07/22/2022    3:59 PM 10/16/2021    3:05 PM  GAD 7 : Generalized Anxiety Score  Nervous, Anxious, on Edge 2 2 2 3   Control/stop worrying 1 1 1 2   Worry too much - different things 2 1 1 2   Trouble relaxing 1 1 1 1   Restless 1 0 0 1  Easily annoyed or irritable 1 1 1  0  Afraid - awful might happen 0 0 0 1  Total GAD 7 Score 8 6 6 10   Anxiety Difficulty Somewhat difficult  Somewhat difficult Somewhat difficult    Would like to have her B12 checked at visit today.    Relevant past medical, surgical, family and social history reviewed and updated as indicated. Interim medical history since our last visit reviewed. Allergies and medications reviewed and updated.  Review of Systems  Psychiatric/Behavioral:  Positive for decreased concentration. Negative for suicidal ideas. The patient is nervous/anxious.     Per HPI unless specifically indicated above     Objective:    BP 122/83   Pulse 95   Ht 5' 10 (1.778 m)   Wt 181 lb (82.1 kg)   LMP 05/28/2023   BMI 25.97 kg/m   Wt Readings from Last 3 Encounters:  07/16/23 181 lb (82.1 kg)  01/15/23 181 lb 12.8 oz (82.5  kg)  11/19/21 174 lb 6.4 oz (79.1 kg)    Physical Exam Vitals and nursing note reviewed.  Constitutional:      General: She is not in acute distress.    Appearance: Normal appearance. She is normal weight. She is not ill-appearing, toxic-appearing or diaphoretic.  HENT:     Head: Normocephalic.     Right Craig: External Craig normal.     Left Craig: External Craig normal.     Nose: Nose normal.     Mouth/Throat:     Mouth: Mucous membranes are moist.     Pharynx: Oropharynx is clear.   Eyes:     General:        Right eye: No discharge.        Left eye: No discharge.     Extraocular Movements: Extraocular movements intact.     Conjunctiva/sclera: Conjunctivae normal.     Pupils: Pupils are equal, round, and reactive to light.    Cardiovascular:     Rate and Rhythm: Normal rate and regular rhythm.  Heart sounds: No murmur heard. Pulmonary:     Effort: Pulmonary effort is normal. No respiratory distress.     Breath sounds: Normal breath sounds. No wheezing or rales.   Musculoskeletal:     Cervical back: Normal range of motion and neck supple.   Skin:    General: Skin is warm and dry.     Capillary Refill: Capillary refill takes less than 2 seconds.   Neurological:     General: No focal deficit present.     Mental Status: She is alert and oriented to person, place, and time. Mental status is at baseline.   Psychiatric:        Mood and Affect: Mood normal.        Behavior: Behavior normal.        Thought Content: Thought content normal.        Judgment: Judgment normal.     Results for orders placed or performed in visit on 01/15/23  Cytology - PAP   Collection Time: 01/15/23  9:33 AM  Result Value Ref Range   Adequacy      Satisfactory for evaluation; transformation zone component ABSENT.   Diagnosis      - Negative for intraepithelial lesion or malignancy (NILM)  Microscopic Examination   Collection Time: 01/15/23  9:47 AM   Urine  Result Value Ref Range   WBC,  UA None seen 0 - 5 /hpf   RBC, Urine 0-2 0 - 2 /hpf   Epithelial Cells (non renal) 0-10 0 - 10 /hpf   Bacteria, UA None seen None seen/Few  Urinalysis, Routine w reflex microscopic   Collection Time: 01/15/23  9:47 AM  Result Value Ref Range   Specific Gravity, UA 1.020 1.005 - 1.030   pH, UA 6.0 5.0 - 7.5   Color, UA Yellow Yellow   Appearance Ur Clear Clear   Leukocytes,UA Negative Negative   Protein,UA Negative Negative/Trace   Glucose, UA Negative Negative   Ketones, UA Negative Negative   RBC, UA Trace (A) Negative   Bilirubin, UA Negative Negative   Urobilinogen, Ur 0.2 0.2 - 1.0 mg/dL   Nitrite, UA Negative Negative   Microscopic Examination See below:   CBC with Differential/Platelet   Collection Time: 01/15/23  9:48 AM  Result Value Ref Range   WBC 5.7 3.4 - 10.8 x10E3/uL   RBC 4.34 3.77 - 5.28 x10E6/uL   Hemoglobin 12.7 11.1 - 15.9 g/dL   Hematocrit 19.1 47.8 - 46.6 %   MCV 91 79 - 97 fL   MCH 29.3 26.6 - 33.0 pg   MCHC 32.3 31.5 - 35.7 g/dL   RDW 29.5 (L) 62.1 - 30.8 %   Platelets 322 150 - 450 x10E3/uL   Neutrophils 47 Not Estab. %   Lymphs 44 Not Estab. %   Monocytes 7 Not Estab. %   Eos 1 Not Estab. %   Basos 1 Not Estab. %   Neutrophils Absolute 2.7 1.4 - 7.0 x10E3/uL   Lymphocytes Absolute 2.5 0.7 - 3.1 x10E3/uL   Monocytes Absolute 0.4 0.1 - 0.9 x10E3/uL   EOS (ABSOLUTE) 0.1 0.0 - 0.4 x10E3/uL   Basophils Absolute 0.0 0.0 - 0.2 x10E3/uL   Immature Granulocytes 0 Not Estab. %   Immature Grans (Abs) 0.0 0.0 - 0.1 x10E3/uL  Comprehensive metabolic panel   Collection Time: 01/15/23  9:48 AM  Result Value Ref Range   Glucose 68 (L) 70 - 99 mg/dL   BUN 14 6 - 20 mg/dL  Creatinine, Ser 0.99 0.57 - 1.00 mg/dL   eGFR 80 >21 HY/QMV/7.84   BUN/Creatinine Ratio 14 9 - 23   Sodium 139 134 - 144 mmol/L   Potassium 3.9 3.5 - 5.2 mmol/L   Chloride 104 96 - 106 mmol/L   CO2 20 20 - 29 mmol/L   Calcium 9.2 8.7 - 10.2 mg/dL   Total Protein 6.6 6.0 - 8.5 g/dL    Albumin 4.3 4.0 - 5.0 g/dL   Globulin, Total 2.3 1.5 - 4.5 g/dL   Bilirubin Total <6.9 0.0 - 1.2 mg/dL   Alkaline Phosphatase 88 44 - 121 IU/L   AST 13 0 - 40 IU/L   ALT 13 0 - 32 IU/L  Lipid panel   Collection Time: 01/15/23  9:48 AM  Result Value Ref Range   Cholesterol, Total 188 100 - 199 mg/dL   Triglycerides 61 0 - 149 mg/dL   HDL 53 >62 mg/dL   VLDL Cholesterol Cal 11 5 - 40 mg/dL   LDL Chol Calc (NIH) 952 (H) 0 - 99 mg/dL   Chol/HDL Ratio 3.5 0.0 - 4.4 ratio  TSH   Collection Time: 01/15/23  9:48 AM  Result Value Ref Range   TSH 2.330 0.450 - 4.500 uIU/mL  B12   Collection Time: 01/15/23  9:48 AM  Result Value Ref Range   Vitamin B-12 389 232 - 1,245 pg/mL  Vitamin D  (25 hydroxy)   Collection Time: 01/15/23  9:48 AM  Result Value Ref Range   Vit D, 25-Hydroxy 43.8 30.0 - 100.0 ng/mL      Assessment & Plan:   Problem List Items Addressed This Visit       Other   Generalized anxiety disorder   Chronic.  Controlled.  Continue with current medication regimen of Wellbutrin  300mg  and PRN Buspar .  Refills sent today. Labs ordered today.  Return to clinic in 6 months for reevaluation.  Call sooner if concerns arise.        Relevant Medications   buPROPion  (WELLBUTRIN  XL) 300 MG 24 hr tablet   busPIRone  (BUSPAR ) 5 MG tablet   Depression - Primary   Chronic.  Controlled.  Continue with current medication regimen of Wellbutrin  300mg  and PRN Buspar .  Refills sent today. Labs ordered today.  Return to clinic in 6 months for reevaluation.  Call sooner if concerns arise.       Relevant Medications   buPROPion  (WELLBUTRIN  XL) 300 MG 24 hr tablet   busPIRone  (BUSPAR ) 5 MG tablet   B12 deficiency   Labs ordered at visit today.  Will make recommendations based on lab results.        Relevant Orders   B12   CBC w/Diff   Other Visit Diagnoses       Vitamin D  deficiency       Relevant Orders   Vitamin D  (25 hydroxy)        Follow up plan: Return in about 3  months (around 10/16/2023) for Recheck labs.

## 2023-07-16 NOTE — Assessment & Plan Note (Signed)
 Chronic.  Controlled.  Continue with current medication regimen of Wellbutrin 300mg  and PRN Buspar.  Refills sent today.  Labs ordered today.  Return to clinic in 6 months for reevaluation.  Call sooner if concerns arise.

## 2023-07-16 NOTE — Assessment & Plan Note (Signed)
 Labs ordered at visit today.  Will make recommendations based on lab results.

## 2023-07-17 ENCOUNTER — Ambulatory Visit: Payer: Self-pay | Admitting: Nurse Practitioner

## 2023-07-17 LAB — CBC WITH DIFFERENTIAL/PLATELET
Basophils Absolute: 0 10*3/uL (ref 0.0–0.2)
Basos: 1 %
EOS (ABSOLUTE): 0 10*3/uL (ref 0.0–0.4)
Eos: 1 %
Hematocrit: 40.8 % (ref 34.0–46.6)
Hemoglobin: 13.3 g/dL (ref 11.1–15.9)
Immature Grans (Abs): 0 10*3/uL (ref 0.0–0.1)
Immature Granulocytes: 0 %
Lymphocytes Absolute: 2.7 10*3/uL (ref 0.7–3.1)
Lymphs: 50 %
MCH: 29.6 pg (ref 26.6–33.0)
MCHC: 32.6 g/dL (ref 31.5–35.7)
MCV: 91 fL (ref 79–97)
Monocytes Absolute: 0.4 10*3/uL (ref 0.1–0.9)
Monocytes: 7 %
Neutrophils Absolute: 2.2 10*3/uL (ref 1.4–7.0)
Neutrophils: 41 %
Platelets: 346 10*3/uL (ref 150–450)
RBC: 4.49 x10E6/uL (ref 3.77–5.28)
RDW: 11.8 % (ref 11.7–15.4)
WBC: 5.3 10*3/uL (ref 3.4–10.8)

## 2023-07-17 LAB — VITAMIN D 25 HYDROXY (VIT D DEFICIENCY, FRACTURES): Vit D, 25-Hydroxy: 43.6 ng/mL (ref 30.0–100.0)

## 2023-07-17 LAB — VITAMIN B12: Vitamin B-12: 361 pg/mL (ref 232–1245)

## 2023-08-18 IMAGING — MR MR FOOT*R* W/O CM
5 series · 40 of 40 positions shown · non-contrast
Comparison: Radiographs 11/01/2012. Recent radiographs unavailable.

CLINICAL DATA: Foot trauma, tendon injury suspected. Evaluate
ruptured extensor tendon at the 5th MTP joint.

EXAM:
MRI OF THE RIGHT FOREFOOT WITHOUT CONTRAST
TECHNIQUE: Multiplanar, multisequence MR imaging of the right forefoot was
performed. No intravenous contrast was administered.

[Series 8: T1 · coronal · right · 3.0mm · 0.31mm/px · 11 of 40 slices shown (1 of 2)]
[im 1/40]
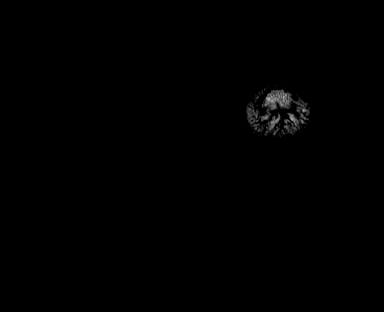
[im 4/40]
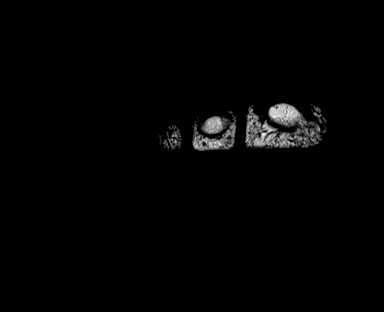
[im 8/40]
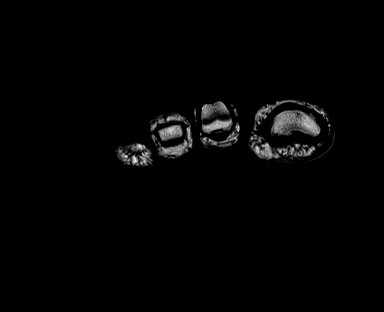
[im 12/40]
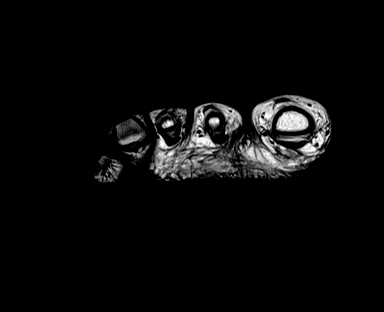
[im 16/40]
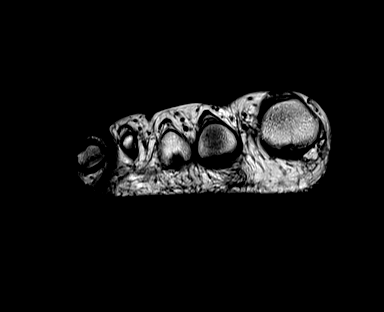
[im 20/40]
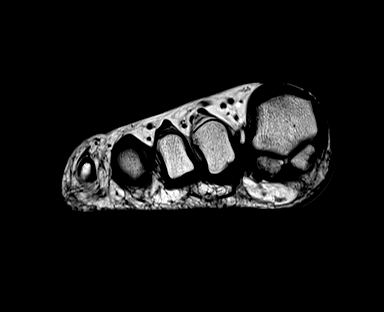
[im 24/40]
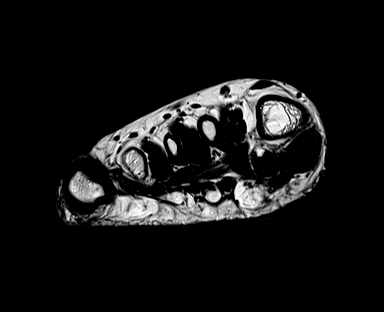
[im 28/40]
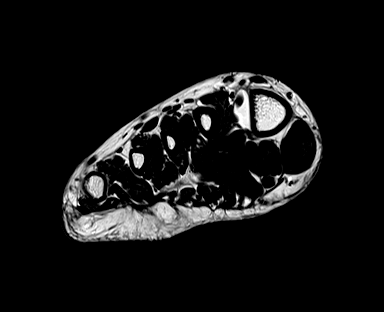
[im 32/40]
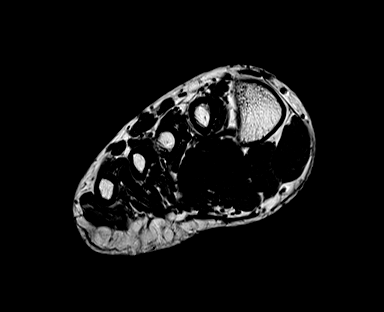
[im 36/40]
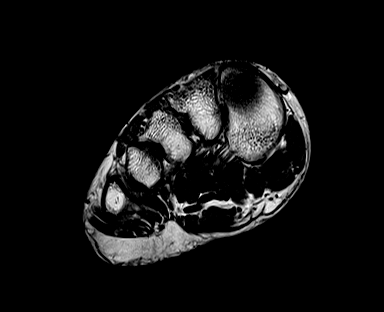
[im 40/40]
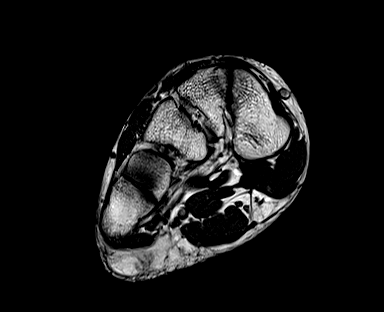

[Series 9: T2 fat-sat · coronal · right · 3.0mm · 0.31mm/px · 11 of 40 slices shown (1 of 2)]
[im 1/40]
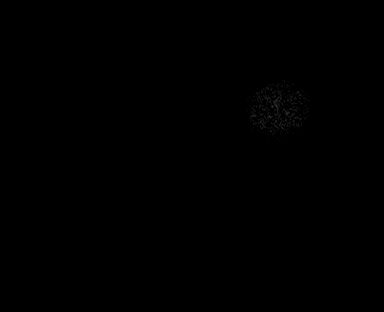
[im 4/40]
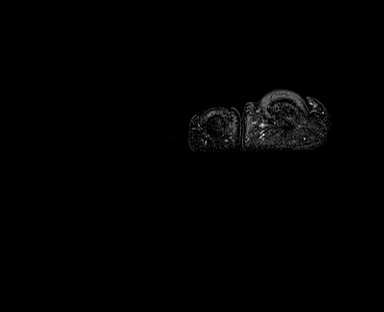
[im 8/40]
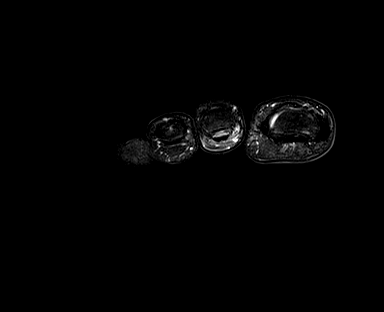
[im 12/40]
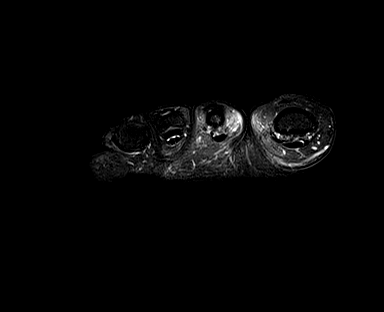
[im 16/40]
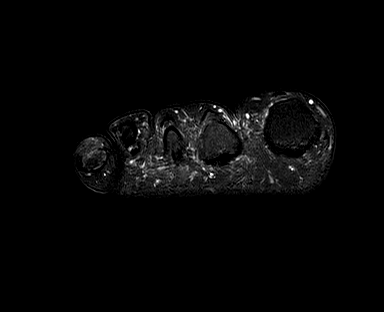
[im 20/40]
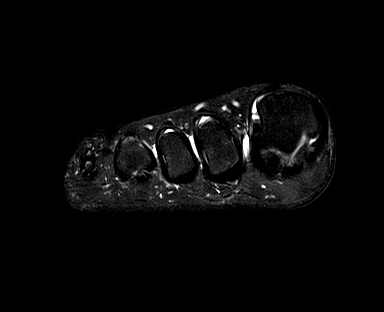
[im 24/40]
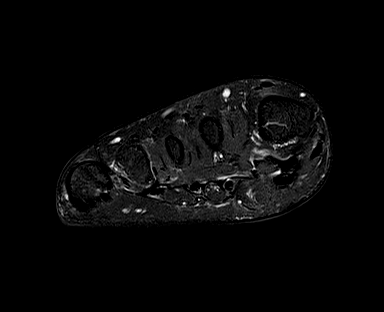
[im 28/40]
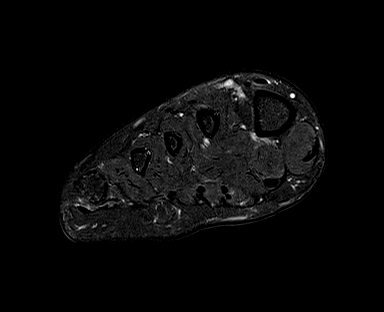
[im 32/40]
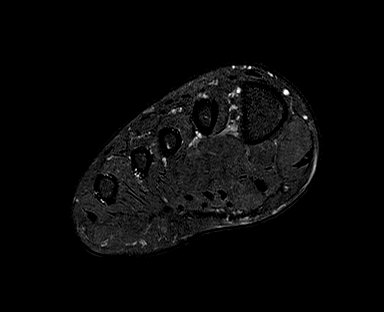
[im 36/40]
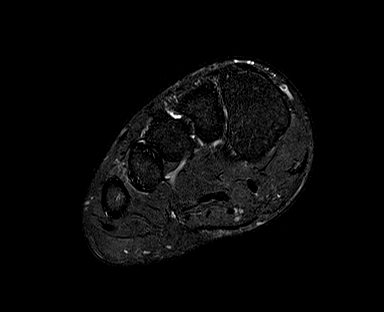
[im 40/40]
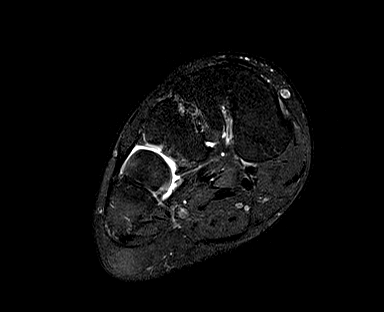

[Series 10: STIR · sagittal · right · 3.0mm · 0.70mm/px · 8 of 27 slices shown]
[im 1/27]
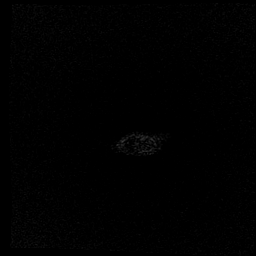
[im 4/27]
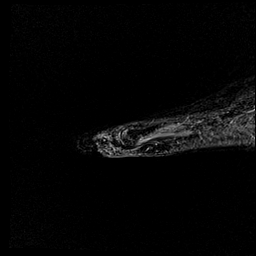
[im 8/27]
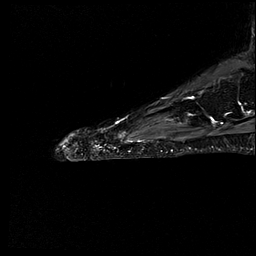
[im 12/27]
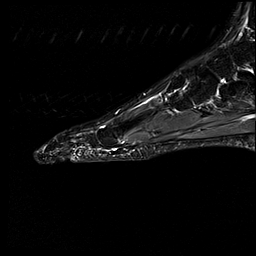
[im 15/27]
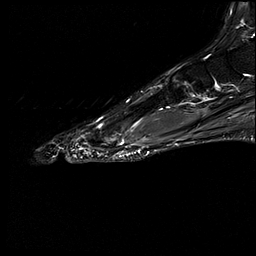
[im 19/27]
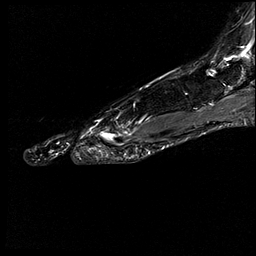
[im 23/27]
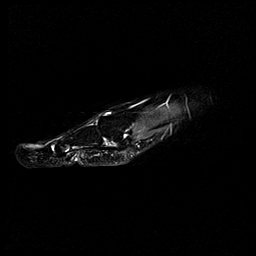
[im 27/27]
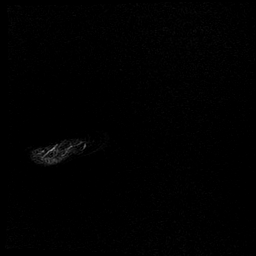

[Series 11: T1 · axial · right · 3.0mm · 0.47mm/px · z∈[-108,-49]mm · 5 of 18 slices shown (2 of 2)]
[im 1/18]
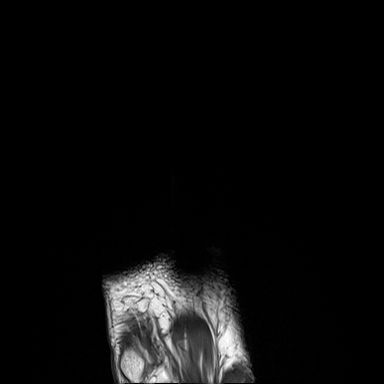
[im 5/18]
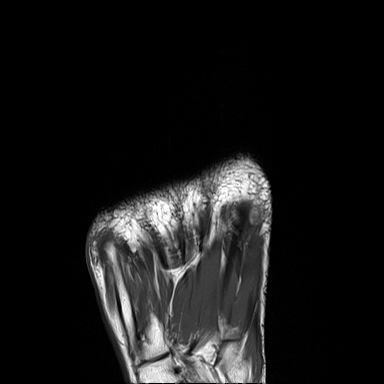
[im 9/18]
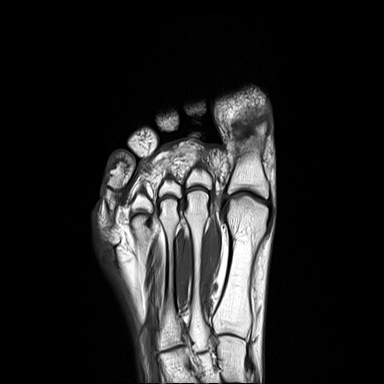
[im 13/18]
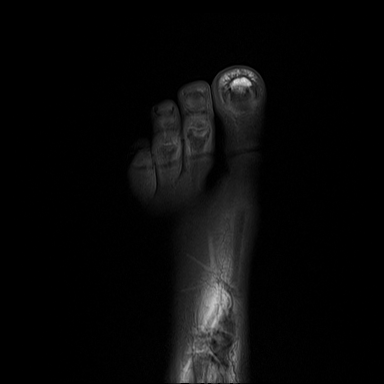
[im 18/18]
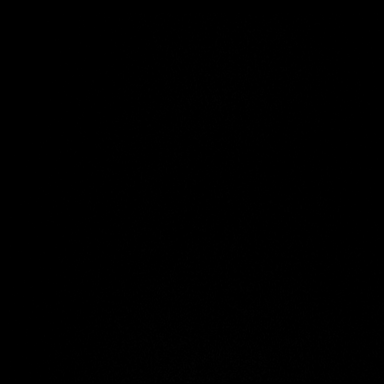

[Series 12: T2 fat-sat · axial · right · 3.0mm · 0.47mm/px · z∈[-108,-49]mm · 5 of 18 slices shown (2 of 2)]
[im 1/18]
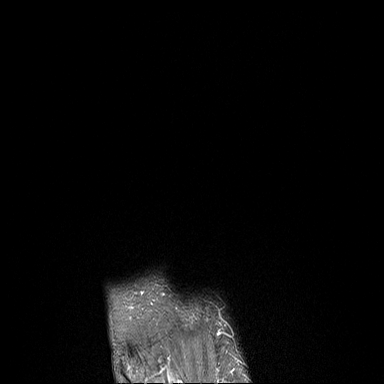
[im 5/18]
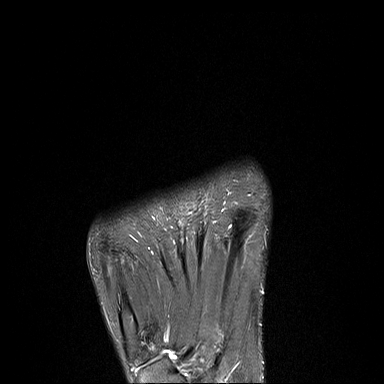
[im 9/18]
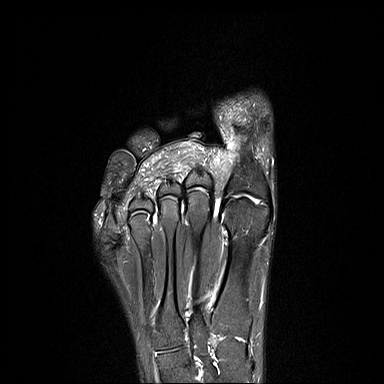
[im 13/18]
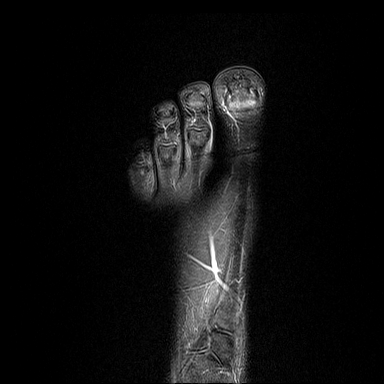
[im 18/18]
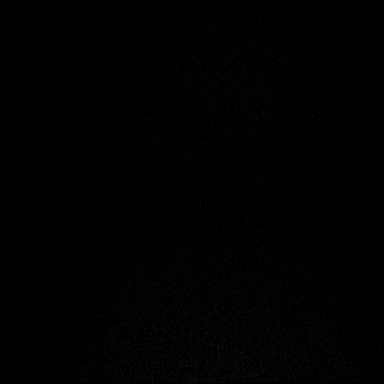

[40 of 40 positions shown; findings below may reference images not displayed]

FINDINGS: Bones/Joint/Cartilage

No evidence of acute fracture or dislocation. The joint spaces are
preserved. No significant joint effusions.

Ligaments

The Lisfranc ligament is intact. The collateral ligaments of the
metatarsophalangeal joints appear intact.

Muscles and Tendons

The extensor tendon of the small toe is not well visualized on the
sagittal images which are somewhat limited. On the transverse axial
images, the tendon appears intact to at least the head of the
proximal 5th phalanx. The tendon is not well seen distal to this,
and there is minimal soft tissue edema in this area. There is no
focal fluid collection or significant tenosynovitis. The flexor
tendons appear unremarkable.

Soft tissues

Mild nonspecific T2 hyperintensity within the soft tissues of the
small toe. No focal fluid collection.
IMPRESSION: 1. The extensor tendon of the small toe appears intact to at least
the head of the proximal 5th phalanx. The tendon is not well seen
distal to this and could be ruptured distally. No significant
tenosynovitis.
2. No acute osseous findings or significant joint effusions.

## 2023-10-23 ENCOUNTER — Encounter: Payer: Self-pay | Admitting: Nurse Practitioner

## 2023-10-23 ENCOUNTER — Ambulatory Visit: Admitting: Nurse Practitioner

## 2023-10-23 VITALS — BP 102/66 | HR 88 | Temp 99.0°F | Ht 70.0 in | Wt 186.8 lb

## 2023-10-23 DIAGNOSIS — E559 Vitamin D deficiency, unspecified: Secondary | ICD-10-CM | POA: Diagnosis not present

## 2023-10-23 DIAGNOSIS — Z23 Encounter for immunization: Secondary | ICD-10-CM | POA: Diagnosis not present

## 2023-10-23 DIAGNOSIS — D229 Melanocytic nevi, unspecified: Secondary | ICD-10-CM

## 2023-10-23 DIAGNOSIS — E538 Deficiency of other specified B group vitamins: Secondary | ICD-10-CM

## 2023-10-23 DIAGNOSIS — R519 Headache, unspecified: Secondary | ICD-10-CM

## 2023-10-23 DIAGNOSIS — G8929 Other chronic pain: Secondary | ICD-10-CM

## 2023-10-23 NOTE — Assessment & Plan Note (Signed)
 Chronic.  Controlled.  Continue with current medication regimen.  Labs ordered today.  Return to clinic in 6 months for reevaluation.  Call sooner if concerns arise.  ? ?

## 2023-10-23 NOTE — Assessment & Plan Note (Signed)
 Chronic.  Controlled.  Has been on injections but hasn't taken any in about 5 months. Labs ordered today.  Will make recommendations based on results.  Return to clinic in 6 months for reevaluation.  Call sooner if concerns arise.

## 2023-10-23 NOTE — Progress Notes (Signed)
 BP 102/66   Pulse 88   Temp 99 F (37.2 C) (Oral)   Ht 5' 10 (1.778 m)   Wt 186 lb 12.8 oz (84.7 kg)   SpO2 98%   BMI 26.80 kg/m    Subjective:    Patient ID: Lisa Craig, female    DOB: 1995-08-04, 28 y.o.   MRN: [id number]  HPI: Lisa Craig is a 28 y.o. female  Chief Complaint  Patient presents with   Anemia   ANEMIA Has been off B12 injections for 5 months.   Anemia status: controlled Etiology of anemia: b12 Duration of anemia treatment:  Compliance with treatment: excellent compliance Iron supplementation side effects: no Severity of anemia: severe Fatigue: yes Decreased exercise tolerance: no  Dyspnea on exertion: no Palpitations: no Bleeding: no Pica: no  Patient would like to see a dermatologist.  She has some moles that she would like to have checked.    Has been having daily headaches since high school.  She would like to see the headache wellness clinic in [place].  Relevant past medical, surgical, family and social history reviewed and updated as indicated. Interim medical history since our last visit reviewed. Allergies and medications reviewed and updated.  Review of Systems  Constitutional:  Positive for fatigue.  Respiratory:  Negative for shortness of breath.   Cardiovascular:  Negative for palpitations.  Skin:        moles  Neurological:  Positive for headaches.    Per HPI unless specifically indicated above     Objective:    BP 102/66   Pulse 88   Temp 99 F (37.2 C) (Oral)   Ht 5' 10 (1.778 m)   Wt 186 lb 12.8 oz (84.7 kg)   SpO2 98%   BMI 26.80 kg/m   Wt Readings from Last 3 Encounters:  10/23/23 186 lb 12.8 oz (84.7 kg)  07/16/23 181 lb (82.1 kg)  01/15/23 181 lb 12.8 oz (82.5 kg)    Physical Exam Vitals and nursing note reviewed.  Constitutional:      General: She is not in acute distress.    Appearance: Normal appearance. She is normal weight. She is not ill-appearing, toxic-appearing or diaphoretic.  HENT:      Head: Normocephalic.     Right Ear: External ear normal.     Left Ear: External ear normal.     Nose: Nose normal.     Mouth/Throat:     Mouth: Mucous membranes are moist.     Pharynx: Oropharynx is clear.  Eyes:     General:        Right eye: No discharge.        Left eye: No discharge.     Extraocular Movements: Extraocular movements intact.     Conjunctiva/sclera: Conjunctivae normal.     Pupils: Pupils are equal, round, and reactive to light.  Cardiovascular:     Rate and Rhythm: Normal rate and regular rhythm.     Heart sounds: No murmur heard. Pulmonary:     Effort: Pulmonary effort is normal. No respiratory distress.     Breath sounds: Normal breath sounds. No wheezing or rales.  Musculoskeletal:     Cervical back: Normal range of motion and neck supple.  Skin:    General: Skin is warm and dry.     Capillary Refill: Capillary refill takes less than 2 seconds.  Neurological:     General: No focal deficit present.     Mental Status: She is alert and  oriented to person, place, and time. Mental status is at baseline.  Psychiatric:        Mood and Affect: Mood normal.        Behavior: Behavior normal.        Thought Content: Thought content normal.        Judgment: Judgment normal.     Results for orders placed or performed in visit on 07/16/23  B12   Collection Time: 07/16/23 10:08 AM  Result Value Ref Range   Vitamin B-12 361 232 - 1,245 pg/mL  CBC w/Diff   Collection Time: 07/16/23 10:08 AM  Result Value Ref Range   WBC 5.3 3.4 - 10.8 x10E3/uL   RBC 4.49 3.77 - 5.28 x10E6/uL   Hemoglobin 13.3 11.1 - 15.9 g/dL   Hematocrit 59.1 65.9 - 46.6 %   MCV 91 79 - 97 fL   MCH 29.6 26.6 - 33.0 pg   MCHC 32.6 31.5 - 35.7 g/dL   RDW 88.1 88.2 - 84.5 %   Platelets 346 150 - 450 x10E3/uL   Neutrophils 41 Not Estab. %   Lymphs 50 Not Estab. %   Monocytes 7 Not Estab. %   Eos 1 Not Estab. %   Basos 1 Not Estab. %   Neutrophils Absolute 2.2 1.4 - 7.0 x10E3/uL    Lymphocytes Absolute 2.7 0.7 - 3.1 x10E3/uL   Monocytes Absolute 0.4 0.1 - 0.9 x10E3/uL   EOS (ABSOLUTE) 0.0 0.0 - 0.4 x10E3/uL   Basophils Absolute 0.0 0.0 - 0.2 x10E3/uL   Immature Granulocytes 0 Not Estab. %   Immature Grans (Abs) 0.0 0.0 - 0.1 x10E3/uL  Vitamin D  (25 hydroxy)   Collection Time: 07/16/23 10:08 AM  Result Value Ref Range   Vit D, 25-Hydroxy 43.6 30.0 - 100.0 ng/mL      Assessment & Plan:   Problem List Items Addressed This Visit       Other   B12 deficiency - Primary   Chronic.  Controlled.  Has been on injections but hasn't taken any in about 5 months. Labs ordered today.  Will make recommendations based on results.  Return to clinic in 6 months for reevaluation.  Call sooner if concerns arise.        Relevant Orders   CBC w/Diff   B12   Vitamin D  deficiency   Chronic.  Controlled.  Continue with current medication regimen.  Labs ordered today.  Return to clinic in 6 months for reevaluation.  Call sooner if concerns arise.        Relevant Orders   Vitamin D  (25 hydroxy)   Other Visit Diagnoses       Chronic intractable headache, unspecified headache type       Chronic.  Was recommended to take daily medication at that time and wasn't interested but would consider it now. Requested referral to headache wellness clinic   Relevant Orders   Ambulatory referral to Neurology     Atypical mole       Referral placed for dermatology.   Relevant Orders   Ambulatory referral to Dermatology     Need for influenza vaccination       Relevant Orders   Flu vaccine trivalent PF, 6mos and older(Flulaval,Afluria,Fluarix,Fluzone) (Completed)     Need for COVID-19 vaccine       Relevant Orders   Pfizer Comirnaty Covid -19 Vaccine 20yrs and older (Completed)        Follow up plan: Return in about 6 months (around 04/21/2024) for  Physical and Fasting labs.

## 2023-10-24 LAB — CBC WITH DIFFERENTIAL/PLATELET
Basophils Absolute: 0 x10E3/uL (ref 0.0–0.2)
Basos: 1 %
EOS (ABSOLUTE): 0 x10E3/uL (ref 0.0–0.4)
Eos: 1 %
Hematocrit: 41.2 % (ref 34.0–46.6)
Hemoglobin: 13.1 g/dL (ref 11.1–15.9)
Immature Grans (Abs): 0 x10E3/uL (ref 0.0–0.1)
Immature Granulocytes: 0 %
Lymphocytes Absolute: 2.7 x10E3/uL (ref 0.7–3.1)
Lymphs: 46 %
MCH: 29.2 pg (ref 26.6–33.0)
MCHC: 31.8 g/dL (ref 31.5–35.7)
MCV: 92 fL (ref 79–97)
Monocytes Absolute: 0.4 x10E3/uL (ref 0.1–0.9)
Monocytes: 8 %
Neutrophils Absolute: 2.4 x10E3/uL (ref 1.4–7.0)
Neutrophils: 44 %
Platelets: 319 x10E3/uL (ref 150–450)
RBC: 4.49 x10E6/uL (ref 3.77–5.28)
RDW: 12.1 % (ref 11.7–15.4)
WBC: 5.6 x10E3/uL (ref 3.4–10.8)

## 2023-10-24 LAB — VITAMIN B12: Vitamin B-12: 336 pg/mL (ref 232–1245)

## 2023-10-24 LAB — VITAMIN D 25 HYDROXY (VIT D DEFICIENCY, FRACTURES): Vit D, 25-Hydroxy: 38 ng/mL (ref 30.0–100.0)

## 2023-10-26 ENCOUNTER — Ambulatory Visit: Payer: Self-pay | Admitting: Nurse Practitioner

## 2023-11-06 ENCOUNTER — Other Ambulatory Visit: Payer: Self-pay | Admitting: Genetic Counselor

## 2023-11-06 DIAGNOSIS — Z006 Encounter for examination for normal comparison and control in clinical research program: Secondary | ICD-10-CM

## 2023-11-16 ENCOUNTER — Ambulatory Visit

## 2023-12-01 ENCOUNTER — Ambulatory Visit (INDEPENDENT_AMBULATORY_CARE_PROVIDER_SITE_OTHER)

## 2023-12-01 DIAGNOSIS — L814 Other melanin hyperpigmentation: Secondary | ICD-10-CM | POA: Diagnosis not present

## 2023-12-01 DIAGNOSIS — D2261 Melanocytic nevi of right upper limb, including shoulder: Secondary | ICD-10-CM | POA: Diagnosis not present

## 2023-12-01 DIAGNOSIS — L821 Other seborrheic keratosis: Secondary | ICD-10-CM

## 2023-12-01 DIAGNOSIS — W908XXA Exposure to other nonionizing radiation, initial encounter: Secondary | ICD-10-CM

## 2023-12-01 DIAGNOSIS — L578 Other skin changes due to chronic exposure to nonionizing radiation: Secondary | ICD-10-CM

## 2023-12-01 DIAGNOSIS — R52 Pain, unspecified: Secondary | ICD-10-CM

## 2023-12-01 DIAGNOSIS — Z1283 Encounter for screening for malignant neoplasm of skin: Secondary | ICD-10-CM

## 2023-12-01 DIAGNOSIS — D2371 Other benign neoplasm of skin of right lower limb, including hip: Secondary | ICD-10-CM

## 2023-12-01 DIAGNOSIS — D229 Melanocytic nevi, unspecified: Secondary | ICD-10-CM

## 2023-12-01 DIAGNOSIS — D1801 Hemangioma of skin and subcutaneous tissue: Secondary | ICD-10-CM

## 2023-12-01 DIAGNOSIS — L918 Other hypertrophic disorders of the skin: Secondary | ICD-10-CM | POA: Diagnosis not present

## 2023-12-01 DIAGNOSIS — D489 Neoplasm of uncertain behavior, unspecified: Secondary | ICD-10-CM

## 2023-12-01 NOTE — Progress Notes (Signed)
 Subjective   Lisa Craig is a 28 y.o. female who presents for the following: Total body skin exam for skin cancer screening and mole check. The patient has spots, moles and lesions to be evaluated, some may be new or changing and the patient may have concern these could be cancer.. Patient is new patient  Today patient reports: Area of concern on the right arm Area of concern on the chest Area of concern on the neck  Review of Systems:    No other skin or systemic complaints except as noted in HPI or Assessment and Plan.  The following portions of the chart were reviewed this encounter and updated as appropriate: medications, allergies, medical history  Relevant Medical History:  n/a   Objective  Well appearing patient in no apparent distress; mood and affect are within normal limits. Examination was performed of the: Full Skin Examination: scalp, head, eyes, ears, nose, lips, neck, chest, axillae, abdomen, back, buttocks, bilateral upper extremities, bilateral lower extremities, hands, feet, fingers, toes, fingernails, and toenails.   Examination notable for: Angioma(s): Scattered red vascular papule(s)  , Lentigo/lentigines: Scattered pigmented macules that are tan to brown in color and are somewhat non-uniform in shape and concentrated in the sun-exposed areas, Nevus/nevi: Scattered well-demarcated, regular, pigmented macule(s) and/or papule(s)  , Actinic Damage/Elastosis: chronic sun damage: dyspigmentation, telangiectasia, and wrinkling, Dermatofibroma(s): Firm dermal nodule(s) with a hyperpigmented halo and a positive dimple sign   - Acrochordon(s): soft, fleshy, skin-colored to tan pedunculated papules located on the anterior neck Examination limited by: Undergarments and Patient deferred removal   -    Right Forearm - Anterior 5mm brown papule    Assessment & Plan   SKIN CANCER SCREENING PERFORMED TODAY.  BENIGN SKIN FINDINGS  - Lentigines  - Seborrheic keratoses  -  Hemangiomas   - Nevus/Multiple Benign Nevi  - Dermatofibroma right lower extremity  - Reassurance provided regarding the benign appearance of lesions noted on exam today; no treatment is indicated in the absence of symptoms/changes. - Reinforced importance of photoprotective strategies including liberal and frequent sunscreen use of a broad-spectrum SPF 30 or greater, use of protective clothing, and sun avoidance for prevention of cutaneous malignancy and photoaging.  Counseled patient on the importance of regular self-skin monitoring as well as routine clinical skin examinations as scheduled.   ACTINIC DAMAGE - Chronic condition, secondary to cumulative UV/sun exposure - Recommend daily broad spectrum sunscreen SPF 30+ to sun-exposed areas, reapply every 2 hours as needed.  - Staying in the shade or wearing long sleeves, sun glasses (UVA+UVB protection) and wide brim hats (4-inch brim around the entire circumference of the hat) are also recommended for sun protection.  - Call for new or changing lesions.  Acrochordons - Benign, patient reassured of the benign nature of acrochrodon  - Explained that more lesions may appear over time - Patient elected to treat these lesions with cryotherapy in clinic, procedure note below - discussed may not be covered by insurance. Discussed can pay out of pocket. Opts to bill insurance    Level of service outlined above   Procedures, orders, diagnosis for this visit:  NEOPLASM OF UNCERTAIN BEHAVIOR Right Forearm - Anterior Epidermal / dermal shaving  Lesion diameter (cm):  0.5 Informed consent: discussed and consent obtained   Timeout: patient name, date of birth, surgical site, and procedure verified   Patient was prepped and draped in usual sterile fashion: area prepped with alcohol. Anesthesia: the lesion was anesthetized in a standard fashion  Anesthetic:  1% lidocaine w/ epinephrine 1-100,000 local infiltration Instrument used: DermaBlade and  flexible razor blade   Hemostasis achieved with: pressure, aluminum chloride and electrodesiccation   Outcome: patient tolerated procedure well   Post-procedure details: wound care instructions given   Post-procedure details comment:  Ointment and a small bandage applied  Specimen 1 - Surgical pathology Differential Diagnosis: Nevus r/o dysplasia   Check Margins: No ACROCHORDON (2) Neck - Anterior (2) Destruction of lesion - Neck - Anterior (2) Complexity: simple   Destruction method: cryotherapy   Informed consent: discussed and consent obtained   Timeout:  patient name, date of birth, surgical site, and procedure verified Lesion destroyed using liquid nitrogen: Yes   Region frozen until ice ball extended beyond lesion: Yes   Cryo cycles: 1 or 2. Outcome: patient tolerated procedure well with no complications   Post-procedure details: wound care instructions given     Neoplasm of uncertain behavior -     Epidermal / dermal shaving -     Surgical pathology; Standing  Acrochordon -     Destruction of lesion    Return to clinic: Return in about 1 year (around 11/30/2024) for TBSE.  I, Emerick Ege, CMA am acting as scribe for Lauraine JAYSON Kanaris, MD.   Documentation: I have reviewed the above documentation for accuracy and completeness, and I agree with the above.  Lauraine JAYSON Kanaris, MD

## 2023-12-01 NOTE — Patient Instructions (Addendum)

## 2023-12-04 LAB — SURGICAL PATHOLOGY

## 2023-12-07 ENCOUNTER — Ambulatory Visit: Payer: Self-pay

## 2023-12-07 NOTE — Progress Notes (Signed)
 LMTRC

## 2023-12-08 NOTE — Telephone Encounter (Signed)
 Advised pt of bx result/sh ?

## 2023-12-08 NOTE — Telephone Encounter (Signed)
-----   Message from Lauraine JAYSON Kanaris sent at 12/07/2023  9:23 AM EST -----    1. Skin, right forearm - anterior :       MELANOCYTIC NEVUS, COMPOUND TYPE, IRRITATED   Please notify patient with below plan: Benign, observe.   ----- Message ----- From: Interface, Lab In Three Zero One Sent: 12/04/2023   4:05 PM EST To: Lauraine JAYSON Kanaris, MD

## 2024-02-26 ENCOUNTER — Other Ambulatory Visit: Payer: Self-pay | Admitting: Nurse Practitioner

## 2024-02-26 NOTE — Telephone Encounter (Signed)
 Requested Prescriptions  Pending Prescriptions Disp Refills   JAIMIESS 0.15-0.03 &0.01 MG tablet [Pharmacy Med Name: L-NORGES/ETH ES(JAIMIESS)PK91] 91 tablet 0    Sig: TAKE 1 TABLET DAILY     OB/GYN:  Contraceptives Passed - 02/26/2024  4:20 PM      Passed - Last BP in normal range    BP Readings from Last 1 Encounters:  10/23/23 102/66         Passed - Valid encounter within last 12 months    Recent Outpatient Visits           4 months ago B12 deficiency   [facility] Melvin Pao, NP   7 months ago Depression, unspecified depression type   [place] [facility] Melvin Pao, NP       Future Appointments             In 9 months Raymund, Lauraine BROCKS, MD [facility]            Passed - Patient is not a smoker

## 2024-04-22 ENCOUNTER — Ambulatory Visit: Admitting: Nurse Practitioner

## 2024-11-30 ENCOUNTER — Ambulatory Visit
